# Patient Record
Sex: Female | Born: 1975 | Race: White | Hispanic: No | Marital: Single | Smoking: Current every day smoker
Health system: Southern US, Community
[De-identification: ages and names within clinical notes are randomized; demographics above are authoritative.]

## PROBLEM LIST (undated history)

## (undated) DIAGNOSIS — Z803 Family history of malignant neoplasm of breast: Secondary | ICD-10-CM

## (undated) DIAGNOSIS — G629 Polyneuropathy, unspecified: Secondary | ICD-10-CM

## (undated) DIAGNOSIS — R8781 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) DNA test positive: Secondary | ICD-10-CM

## (undated) DIAGNOSIS — Z8041 Family history of malignant neoplasm of ovary: Secondary | ICD-10-CM

## (undated) DIAGNOSIS — E669 Obesity, unspecified: Secondary | ICD-10-CM

## (undated) DIAGNOSIS — R87612 Low grade squamous intraepithelial lesion on cytologic smear of cervix (LGSIL): Secondary | ICD-10-CM

## (undated) DIAGNOSIS — D649 Anemia, unspecified: Secondary | ICD-10-CM

## (undated) DIAGNOSIS — K760 Fatty (change of) liver, not elsewhere classified: Secondary | ICD-10-CM

## (undated) DIAGNOSIS — L732 Hidradenitis suppurativa: Secondary | ICD-10-CM

## (undated) DIAGNOSIS — Z1371 Encounter for nonprocreative screening for genetic disease carrier status: Secondary | ICD-10-CM

## (undated) DIAGNOSIS — G4730 Sleep apnea, unspecified: Secondary | ICD-10-CM

## (undated) DIAGNOSIS — E119 Type 2 diabetes mellitus without complications: Secondary | ICD-10-CM

## (undated) DIAGNOSIS — J449 Chronic obstructive pulmonary disease, unspecified: Secondary | ICD-10-CM

## (undated) DIAGNOSIS — Z8481 Family history of carrier of genetic disease: Secondary | ICD-10-CM

## (undated) DIAGNOSIS — A630 Anogenital (venereal) warts: Secondary | ICD-10-CM

## (undated) HISTORY — DX: Obesity, unspecified: E66.9

## (undated) HISTORY — DX: Hidradenitis suppurativa: L73.2

## (undated) HISTORY — DX: Family history of carrier of genetic disease: Z84.81

## (undated) HISTORY — DX: Family history of malignant neoplasm of ovary: Z80.41

## (undated) HISTORY — DX: Chronic obstructive pulmonary disease, unspecified: J44.9

## (undated) HISTORY — DX: 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) DNA test positive: R87.810

## (undated) HISTORY — DX: Sleep apnea, unspecified: G47.30

## (undated) HISTORY — DX: Anogenital (venereal) warts: A63.0

## (undated) HISTORY — PX: CHOLECYSTECTOMY: SHX55

## (undated) HISTORY — DX: Type 2 diabetes mellitus without complications: E11.9

## (undated) HISTORY — PX: GALLBLADDER SURGERY: SHX652

## (undated) HISTORY — DX: Encounter for nonprocreative screening for genetic disease carrier status: Z13.71

## (undated) HISTORY — DX: Family history of malignant neoplasm of breast: Z80.3

## (undated) HISTORY — DX: Low grade squamous intraepithelial lesion on cytologic smear of cervix (LGSIL): R87.612

---

## 2010-04-21 ENCOUNTER — Emergency Department: Payer: Self-pay | Admitting: Internal Medicine

## 2010-09-16 ENCOUNTER — Ambulatory Visit: Payer: Self-pay | Admitting: Pain Medicine

## 2012-04-25 ENCOUNTER — Emergency Department: Payer: Self-pay | Admitting: Emergency Medicine

## 2012-04-25 LAB — COMPREHENSIVE METABOLIC PANEL
Alkaline Phosphatase: 93 U/L (ref 50–136)
Anion Gap: 8 (ref 7–16)
BUN: 17 mg/dL (ref 7–18)
Bilirubin,Total: 0.2 mg/dL (ref 0.2–1.0)
Chloride: 108 mmol/L — ABNORMAL HIGH (ref 98–107)
Co2: 26 mmol/L (ref 21–32)
Creatinine: 1.05 mg/dL (ref 0.60–1.30)
EGFR (African American): >60
EGFR (Non-African Amer.): >60
Osmolality: 287 (ref 275–301)
Potassium: 4.3 mmol/L (ref 3.5–5.1)
SGPT (ALT): 36 U/L (ref 12–78)
Sodium: 142 mmol/L (ref 136–145)
Total Protein: 6.9 g/dL (ref 6.4–8.2)

## 2012-04-25 LAB — URINALYSIS, COMPLETE
Bilirubin,UR: NEGATIVE
Hyaline Cast: 2
Ph: 5 (ref 4.5–8.0)
Protein: 30
RBC,UR: 4 /HPF (ref 0–5)
Specific Gravity: 1.041 (ref 1.003–1.030)
Squamous Epithelial: 5

## 2012-04-25 LAB — CBC
HCT: 38.3 % (ref 35.0–47.0)
HGB: 12.5 g/dL (ref 12.0–16.0)
MCH: 28.5 pg (ref 26.0–34.0)
MCHC: 32.5 g/dL (ref 32.0–36.0)
MCV: 88 fL (ref 80–100)
RDW: 14.8 % — ABNORMAL HIGH (ref 11.5–14.5)

## 2012-07-05 DIAGNOSIS — R87612 Low grade squamous intraepithelial lesion on cytologic smear of cervix (LGSIL): Secondary | ICD-10-CM

## 2012-07-05 HISTORY — DX: Low grade squamous intraepithelial lesion on cytologic smear of cervix (LGSIL): R87.612

## 2012-11-22 ENCOUNTER — Ambulatory Visit: Payer: Self-pay | Admitting: Family Medicine

## 2014-03-26 DIAGNOSIS — K589 Irritable bowel syndrome without diarrhea: Secondary | ICD-10-CM | POA: Insufficient documentation

## 2014-03-26 DIAGNOSIS — R1013 Epigastric pain: Secondary | ICD-10-CM | POA: Insufficient documentation

## 2017-01-21 ENCOUNTER — Telehealth: Payer: Self-pay | Admitting: Obstetrics & Gynecology

## 2017-01-21 NOTE — Telephone Encounter (Signed)
Patient being referred by [facility] for HVP level. Left voicemail for patient to call back to be schedule

## 2017-01-21 NOTE — Telephone Encounter (Signed)
Pt is schedule 02/01/17

## 2017-02-01 ENCOUNTER — Encounter: Payer: Self-pay | Admitting: Obstetrics and Gynecology

## 2017-02-01 ENCOUNTER — Ambulatory Visit (INDEPENDENT_AMBULATORY_CARE_PROVIDER_SITE_OTHER): Payer: Medicaid Other | Admitting: Obstetrics and Gynecology

## 2017-02-01 VITALS — BP 140/100 | HR 97 | Ht 66.0 in | Wt 365.0 lb

## 2017-02-01 DIAGNOSIS — Z803 Family history of malignant neoplasm of breast: Secondary | ICD-10-CM | POA: Diagnosis not present

## 2017-02-01 DIAGNOSIS — Z8481 Family history of carrier of genetic disease: Secondary | ICD-10-CM

## 2017-02-01 DIAGNOSIS — A630 Anogenital (venereal) warts: Secondary | ICD-10-CM | POA: Diagnosis not present

## 2017-02-01 DIAGNOSIS — Z124 Encounter for screening for malignant neoplasm of cervix: Secondary | ICD-10-CM

## 2017-02-01 DIAGNOSIS — Z1151 Encounter for screening for human papillomavirus (HPV): Secondary | ICD-10-CM | POA: Diagnosis not present

## 2017-02-01 DIAGNOSIS — R8781 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) DNA test positive: Secondary | ICD-10-CM

## 2017-02-01 DIAGNOSIS — Z1379 Encounter for other screening for genetic and chromosomal anomalies: Secondary | ICD-10-CM

## 2017-02-01 NOTE — Progress Notes (Signed)
Chief Complaint  Patient presents with  . Genital Warts    HPI:      [name] is a 41 y.o. No obstetric history on file. who LMP was Patient's last menstrual period was 01/25/2017., presents today for NP eval of ext genital warts, referred by PCP. She has 2 large warts vulvar area that have been present for several yrs. They were frozen twice in the past with Dr. Cristino Cisneros without any sx change. Pt has tried OTC wart removers without any success. She would like treated today.   She also has a hx of abn paps/POS HPV DNA 2014 and 2015. Last pap 01/03/14 was ASCUS/POS HPV DNA HIGH RISK. She is due for repeat pap today. Last colpo/bx 2014.  She also has a strong FH of breast/ovar cancer. Her affected sister's daughter (niece) had breast cancer in her 82s and was found to be BRCA pos (pt doesn't know 1 or 2). Pt would like genetic testing.     Past Medical History:  Diagnosis Date  . 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) DNA test positive   . Diabetes mellitus without complication ([place])   . Genital warts   . LGSIL on Pap smear of cervix 2014  . Obesity   . Sleep apnea     Past Surgical History:  Procedure Laterality Date  . GALLBLADDER SURGERY      Family History  Problem Relation Age of Onset  . Breast cancer Mother 79  . Breast cancer Sister 58  . Breast cancer Maternal Aunt 53  . Ovarian cancer Maternal Aunt 34  . Breast cancer Other 59       BRCA pos (daughter of affected aunt with breast cancer)    Social History   Social History  . Marital status: Single    Spouse name: N/A  . Number of children: N/A  . Years of education: N/A   Occupational History  . Not on file.   Social History Main Topics  . Smoking status: Current Every Day Smoker  . Smokeless tobacco: Never Used  . Alcohol use Yes  . Drug use: No  . Sexual activity: No   Other Topics Concern  . Not on file   Social History Narrative  . No narrative on file     Current Outpatient  Prescriptions:  .  metFORMIN (GLUCOPHAGE) 1000 MG tablet, Take 1,000 mg by mouth 2 (two) times daily with a meal., Disp: , Rfl:  .  cyclobenzaprine (FLEXERIL) 10 MG tablet, Take 10 mg by mouth., Disp: , Rfl:  .  FLUoxetine (PROZAC) 40 MG capsule, Take by mouth., Disp: , Rfl:  .  gabapentin (NEURONTIN) 300 MG capsule, Take by mouth., Disp: , Rfl:  .  Lisinopril 1 MG/ML SOLN, lisinopril, Disp: , Rfl:  .  METFORMIN & DIET MANAGE PROD PO, metformin, Disp: , Rfl:    ROS:  Review of Systems  Constitutional: Negative for fever.  Gastrointestinal: Negative for blood in stool, constipation, diarrhea, nausea and vomiting.  Genitourinary: Positive for genital sores. Negative for dyspareunia, dysuria, flank pain, frequency, hematuria, urgency, vaginal bleeding, vaginal discharge and vaginal pain.  Musculoskeletal: Negative for back pain.  Skin: Negative for rash.     OBJECTIVE:   Vitals:  BP (!) 140/100   Pulse 97   Ht '5\' 6"'  (1.676 m)   Wt (!) 365 lb (165.6 kg)   LMP 01/25/2017   BMI 58.91 kg/m   Physical Exam  Constitutional: She is oriented to  person, place, and time and well-developed, well-nourished, and in no distress. Vital signs are normal.  Genitourinary: Vagina normal, uterus normal, cervix normal, right adnexa normal and left adnexa normal. Uterus is not enlarged. Cervix exhibits no motion tenderness and no tenderness. Right adnexum displays no mass and no tenderness. Left adnexum displays no mass and no tenderness. Vulva exhibits lesion. Vulva exhibits no erythema, no exudate, no rash and no tenderness. Vagina exhibits no lesion.  Genitourinary Comments: 2 LARGE WARTY LESION RT PERINEAL AREA; ~2.0 CM LESION AND ~1 CM LESION  Neurological: She is oriented to person, place, and time.  Vitals reviewed.    Assessment/Plan: Genital warts - Treated with TCA. Wash in 4-6 hrs. F/u at 6 wk MyRisk results f/u. If no sx improvement, will have PCP refer to derm ([facility]) for further tx.    Family history of breast cancer - Plan: Integrated BRACAnalysis  Genetic testing - BRCA mutation in niece with breast cancer. MyRisk testing discussed and done today. RTO 6 wks for results.  - Plan: Integrated BRACAnalysis  Cervical cancer screening - Will call pt with resutls. - Plan: IGP, Aptima HPV  Screening for HPV (human papillomavirus) - Plan: IGP, Aptima HPV  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) DNA test positive - Plan: IGP, Aptima HPV  Family history of BRCA gene positive     Return in about 6 weeks (around 03/15/2017) for [facility] f/u.  Amy Cisneros B. Amy Villarruel, PA-C 02/01/2017 10:49 AM

## 2017-02-02 DIAGNOSIS — Z1371 Encounter for nonprocreative screening for genetic disease carrier status: Secondary | ICD-10-CM

## 2017-02-02 HISTORY — DX: Encounter for nonprocreative screening for genetic disease carrier status: Z13.71

## 2017-02-03 LAB — IGP, APTIMA HPV
HPV APTIMA: NEGATIVE
PAP Smear Comment: 0

## 2017-02-04 ENCOUNTER — Encounter: Payer: Self-pay | Admitting: Obstetrics and Gynecology

## 2017-02-08 DIAGNOSIS — M5136 Other intervertebral disc degeneration, lumbar region: Secondary | ICD-10-CM | POA: Insufficient documentation

## 2017-02-08 DIAGNOSIS — F1190 Opioid use, unspecified, uncomplicated: Secondary | ICD-10-CM | POA: Insufficient documentation

## 2017-02-08 DIAGNOSIS — M5442 Lumbago with sciatica, left side: Secondary | ICD-10-CM

## 2017-02-08 DIAGNOSIS — M47816 Spondylosis without myelopathy or radiculopathy, lumbar region: Secondary | ICD-10-CM | POA: Insufficient documentation

## 2017-02-08 DIAGNOSIS — G894 Chronic pain syndrome: Secondary | ICD-10-CM | POA: Insufficient documentation

## 2017-02-08 DIAGNOSIS — M179 Osteoarthritis of knee, unspecified: Secondary | ICD-10-CM | POA: Insufficient documentation

## 2017-02-08 DIAGNOSIS — M5126 Other intervertebral disc displacement, lumbar region: Secondary | ICD-10-CM | POA: Insufficient documentation

## 2017-02-08 DIAGNOSIS — G8929 Other chronic pain: Secondary | ICD-10-CM | POA: Insufficient documentation

## 2017-02-08 DIAGNOSIS — M1710 Unilateral primary osteoarthritis, unspecified knee: Secondary | ICD-10-CM | POA: Insufficient documentation

## 2017-02-08 DIAGNOSIS — M5416 Radiculopathy, lumbar region: Secondary | ICD-10-CM | POA: Insufficient documentation

## 2017-02-08 DIAGNOSIS — Z6841 Body Mass Index (BMI) 40.0 and over, adult: Secondary | ICD-10-CM

## 2017-02-08 NOTE — Progress Notes (Signed)
Patient's Name: Amy Cisneros  MRN: [id number]  Referring Provider: Geannie Risen, MD  DOB: 1976/06/30  PCP: [name], MD  DOS: 02/09/2017  Note by: Gaspar Cola, MD  Service setting: Ambulatory outpatient  Specialty: Interventional Pain Management  Location: [facility] (AMB) Pain Management Facility  Visit type: Initial Patient Evaluation  Patient type: New Patient   Primary Reason(s) for Visit: Encounter for initial evaluation of one or more chronic problems (new to examiner) potentially causing chronic pain, and posing a threat to normal musculoskeletal function. (Level of risk: High) CC: Back Pain (lower bilateral); Shoulder Pain (bilateral); Knee Pain (bilateral); and Hip Pain (bilateral)  HPI  Amy Cisneros is a 41 y.o. year old, female patient, who comes today to see [place] for the first time for an initial evaluation of her chronic pain. She has Family history of breast cancer; 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) DNA test positive; Family history of BRCA gene positive; Dyspepsia; IBS (irritable bowel syndrome); Osteoarthritis of knee; Chronic pain syndrome; Opiate use; Lumbar paracentral disc bulge at L2-3 (Left); Lumbar facet hypertrophy (Bilateral); Chronic lumbar radiculitis of L3 (Left); Chronic low back pain (Primary Area of Pain) (Bilateral) (L>R); Morbid obesity with BMI of 50.0-59.9, adult ([place]); Chronic lower extremity pain (Secondary Area of Pain) (Bilateral) (R>L); Chronic knee pain (Tertiary Area of Pain) (Bilateral) (R>L); Chronic hip pain (Fourth Area of Pain) (Bilateral) (R>L); Chronic shoulder pain (Fifth Area of Pain) (Bilateral) (R>L); Chronic neck pain (Bilateral) (R>L); and NSAID long-term use on her problem list. Today she comes in for evaluation of her Back Pain (lower bilateral); Shoulder Pain (bilateral); Knee Pain (bilateral); and Hip Pain (bilateral)  Pain Assessment: Location: Left, Right, Lower (bilateral)  (shoulders, hips and knees) Radiating: shoulder  pain radiates down arms into fingers,  Onset: More than a month ago Duration: Chronic pain Quality: Constant, Numbness, Burning, Discomfort, Restless Severity: 8 /10 (self-reported pain score)  Note: Reported level is inconsistent with clinical observations. Clinically the patient looks like a 2/10 Information on the proper use of the pain scale provided to the patient today Effect on ADL: activity has to be done in short time frames  Timing: Constant Modifying factors: after getting up from sitting, walking to get pain worked out.  medications  Onset and Duration: Present longer than 3 months Cause of pain: Unknown Severity: Getting worse, NAS-11 at its worse: 12/10, NAS-11 at its best: 7/10, NAS-11 now: 9/10 and NAS-11 on the average: 11/10 Timing: Not influenced by the time of the day Aggravating Factors: Bending, Climbing, Kneeling, Lifiting, Motion, Prolonged sitting, Prolonged standing, Squatting, Stooping , Twisting, Walking, Walking uphill and Walking downhill Alleviating Factors: Hot packs, Lying down, Medications, Resting, Sleeping and Warm showers or baths Associated Problems: Day-time cramps, Night-time cramps, Depression, Fatigue, Nausea, Numbness, Swelling, Tingling, Weakness, Pain that wakes patient up and Pain that does not allow patient to sleep Quality of Pain: Aching, Agonizing, Annoying, Burning, Constant, Cramping, Deep, Disabling, Dreadful, Exhausting, Getting longer, Horrible, Hot, Pulsating, Sharp, Shooting, Sickening, Stabbing, Tender, Throbbing, Tingling, Tiring and Uncomfortable Previous Examinations or Tests: MRI scan, X-rays, Nerve conduction test and Orthopedic evaluation Previous Treatments: Narcotic medications  The patient comes into the clinics today for the first time for a chronic pain management evaluation. According to the patient the primary area of pain is that of the lower back with the pain being bilateral. However, her pain seems to be worse on the  right side when compared to the left. She denies any prior surgeries, nerve blocks,  joint injections, physical therapy, or any recent x-rays.  The patient's second area of pain is that of the lower extremities with the right being worst on the left. She indicates having had a nerve conduction test done at Dr. Gala Murdoch office. Today we have requested copies of those. The patient denies any lower extremity surgeries. She indicates having had some physical therapy in the 1990s which consisted of 2-3 visits per week for a period of 6 weeks. In terms of the distribution of the pain the patient describes that in the case of the right lower extremity the pain goes all the way down to the top of her foot in what seems to be an L5 dermatomal distribution. In the case of the left lower extremity, the distribution is the pain is exactly the same as in the right leg, but not quite as intense.  The third area of pain is that of the knees with the right knee being worse than the left. She again denies any surgeries, physical therapy, but does admit to some x-rays that were done approximately 2 years ago. She also indicates having had one knee injection on the right knee which helped for approximately 3 days.  The next area of pain is that of the hips with the right being worst on the left. She denies any surgeries, joint injections, nerve blocks, x-rays, or physical therapy.  Next is the pain on the shoulders which she indicates is worse on the right side. Again, she denies any physical therapy, surgeries, joint injection, nerve blocks, or recent x-rays. She does indicate having some intermittent neck pain in the posterior aspect affecting both sides but with the right side being worst on the left. She denies any surgeries, nerve blocks, joint injections, physical therapy, or x-rays of that area.  The patient indicates treating her pain with gabapentin, Flexeril, naproxen, and extended release acetaminophen. She  indicates that she was taking some tramadol but this was making her a little sleepy and they stopped it a couple years ago.  Today I took the time to provide the patient with information regarding my pain practice. The patient was informed that my practice is divided into two sections: an interventional pain management section, as well as a completely separate and distinct medication management section. I explained that I have procedure days for my interventional therapies, and evaluation days for follow-ups and medication management. Because of the amount of documentation required during both, they are kept separated. This means that there is the possibility that she may be scheduled for a procedure on one day, and medication management the next. I have also informed her that because of staffing and facility limitations, I no longer take patients for medication management only. To illustrate the reasons for this, I gave the patient the example of surgeons, and how inappropriate it would be to refer a patient to his/her care, just to write for the post-surgical antibiotics on a surgery done by a different surgeon.   Because interventional pain management is my board-certified specialty, the patient was informed that joining my practice means that they are open to any and all interventional therapies. I made it clear that this does not mean that they will be forced to have any procedures done. What this means is that I believe interventional therapies to be essential part of the diagnosis and proper management of chronic pain conditions. Therefore, patients not interested in these interventional alternatives will be better served under the care of a different practitioner.  The patient was also made aware of my Comprehensive Pain Management Safety Guidelines where by joining my practice, they limit all of their nerve blocks and joint injections to those done by our practice, for as long as we are retained to  manage their care.   Historic Controlled Substance Pharmacotherapy Review  PMP and historical list of controlled substances: Tramadol 50 mg; hydrocodone/APAP 5/325; hydrocodone/APAP 7.5/325.  Highest opioid analgesic regimen found: Hydrocodone/APAP 5/325, one tablet by mouth 5 times a day (25 mg/day of hydrocodone) (25 MME/day) Most recent opioid analgesic: Tramadol 50 mg 1 tablet by mouth twice a day (100 mg/day of tramadol) (10 MME/day) (last prescribed on 11/14/2014 and filled on 12/11/2014) ( Current opioid analgesics: None  Highest recorded MME/day: 25 mg/day MME/day: 0 mg/day Medications: The patient did not bring the medication(s) to the appointment, as requested in our "New Patient Package" Pharmacodynamics: Desired effects: Analgesia: The patient reports >50% benefit. Reported improvement in function: The patient reports medication allows her to accomplish basic ADLs. Clinically meaningful improvement in function (CMIF): Sustained CMIF goals met Perceived effectiveness: Described as relatively effective, allowing for increase in activities of daily living (ADL) Undesirable effects: Side-effects or Adverse reactions: None reported Historical Monitoring: The patient  reports that she does not use drugs. List of all UDS Test(s): No results found for: MDMA, COCAINSCRNUR, PCPSCRNUR, PCPQUANT, CANNABQUANT, THCU, [place] List of all Serum Drug Screening Test(s):  No results found for: AMPHSCRSER, BARBSCRSER, BENZOSCRSER, COCAINSCRSER, PCPSCRSER, PCPQUANT, THCSCRSER, CANNABQUANT, OPIATESCRSER, OXYSCRSER, PROPOXSCRSER Historical Background Evaluation: [place] PDMP: Six (6) year initial data search conducted.             [place] Department of public safety, offender search: Editor, commissioning Information) Non-contributory Risk Assessment Profile: Aberrant behavior: None observed or detected today Risk factors for fatal opioid overdose: None identified today Fatal overdose hazard ratio (HR): Calculation  deferred Non-fatal overdose hazard ratio (HR): Calculation deferred Risk of opioid abuse or dependence: 0.7-3.0% with doses ? 36 MME/day and 6.1-26% with doses ? 120 MME/day. Substance use disorder (SUD) risk level: Pending results of Medical Psychology Evaluation for SUD Opioid risk tool (ORT) (Total Score): 4     Opioid Risk Tool - 02/09/17 1001      Family History of Substance Abuse   Alcohol Positive Female  father sober now   Illegal Drugs Negative   Rx Drugs Negative     Personal History of Substance Abuse   Alcohol Negative   Illegal Drugs Negative   Rx Drugs Negative     Psychological Disease   Psychological Disease Positive   ADD Negative   OCD Positive   Bipolar Negative   Schizophrenia Negative   Depression Positive  prozac handles depression     Total Score   Opioid Risk Tool Scoring 4   Opioid Risk Interpretation Moderate Risk     ORT Scoring interpretation table:  Score <3 = Low Risk for SUD  Score between 4-7 = Moderate Risk for SUD  Score >8 = High Risk for Opioid Abuse   PHQ-2 Depression Scale:  Total score: 0  PHQ-2 Scoring interpretation table: (Score and probability of major depressive disorder)  Score 0 = No depression  Score 1 = 15.4% Probability  Score 2 = 21.1% Probability  Score 3 = 38.4% Probability  Score 4 = 45.5% Probability  Score 5 = 56.4% Probability  Score 6 = 78.6% Probability   PHQ-9 Depression Scale:  Total score: 0  PHQ-9 Scoring interpretation table:  Score 0-4 = No depression  Score 5-9 = Mild depression  Score 10-14 = Moderate depression  Score 15-19 = Moderately severe depression  Score 20-27 = Severe depression (2.4 times higher risk of SUD and 2.89 times higher risk of overuse)   Pharmacologic Plan: Pending ordered tests and/or consults            Initial impression: Pending review of available data and ordered tests.  Meds   Current Meds  Medication Sig  . acetaminophen (ACETAMINOPHEN 8 HOUR) 650 MG CR  tablet Take 650 mg by mouth every 8 (eight) hours as needed for pain.  Marland Kitchen albuterol (ACCUNEB) 0.63 MG/3ML nebulizer solution Take 1 ampule by nebulization every 6 (six) hours as needed for wheezing.  Marland Kitchen albuterol (PROVENTIL HFA;VENTOLIN HFA) 108 (90 Base) MCG/ACT inhaler Inhale 2 puffs into the lungs every 6 (six) hours as needed for wheezing or shortness of breath.  . ciprofloxacin (CIPRO) 500 MG tablet Take 500 mg by mouth daily.  . clindamycin (CLEOCIN) 300 MG capsule Take 300 mg by mouth 2 (two) times daily.  . cyclobenzaprine (FLEXERIL) 10 MG tablet Take 10 mg by mouth 3 (three) times daily.   Marland Kitchen FLUoxetine (PROZAC) 40 MG capsule Take 40 mg by mouth daily.   Marland Kitchen gabapentin (NEURONTIN) 300 MG capsule Take 300 mg by mouth 3 (three) times daily.   Marland Kitchen lisinopril-hydrochlorothiazide (PRINZIDE,ZESTORETIC) 10-12.5 MG tablet Take 1 tablet by mouth daily.  . medroxyPROGESTERone (PROVERA) 10 MG tablet Take 10 mg by mouth daily.  . metFORMIN (GLUCOPHAGE) 1000 MG tablet Take 1,000 mg by mouth 2 (two) times daily with a meal.  . naproxen (NAPROSYN) 500 MG tablet Take 500 mg by mouth 2 (two) times daily with a meal.  . tiotropium (SPIRIVA) 18 MCG inhalation capsule Place 18 mcg into inhaler and inhale daily.  . traZODone (DESYREL) 50 MG tablet Take 50 mg by mouth at bedtime.    Imaging Review  Lumbosacral Imaging: Lumbar MR wo contrast:  Results for orders placed in visit on 11/22/12  MR L Spine Ltd W/O Cm   Narrative * PRIOR REPORT IMPORTED FROM AN EXTERNAL SYSTEM *   PRIOR REPORT IMPORTED FROM THE SYNGO [place] EXAM:    low back pain  COMMENTS:   PROCEDURE:     MMR - MMR LUMBAR SPINE WO CONTRAST  - Nov 22 2012 11:47AM   RESULT:     MRI LUMBAR SPINE WITHOUT CONTRAST   HISTORY: Low back pain   COMPARISON: None   TECHNIQUE: Multiplanar and multisequence MRI of the lumbar spine were  obtained, without administration of IV contrast.   FINDINGS:   The vertebral bodies of  the lumbar spine are normal in size and alignment.  There is normal bone marrow signal demonstrated throughout the vertebra.  The  intervertebral disc spaces are well-maintained.   The spinal cord is of normal volume and contour. The cord terminates  normally at L1 . The nerve roots of the cauda equina and the filum  terminale  have the usual appearance.   The visualized portions of the SI joints are unremarkable.   The imaged intra-abdominal contents are unremarkable.   T12-L1: No significant disc bulge. No evidence of neural foraminal or  central stenosis.   L1-L2: No significant disc bulge. No evidence of neural foraminal or  central  stenosis.   L2-L3: Mild left paracentral disc bulge which abuts the left intraspinal  L3  nerve root. No evidence of neural foraminal or central stenosis.   L3-L4:  No significant disc bulge. No evidence of neural foraminal or  central  stenosis.   L4-L5: No significant disc bulge. No evidence of neural foraminal or  central  stenosis. Mild bilateral facet arthropathy.   L5-S1: No significant disc bulge. No evidence of neural foraminal or  central  stenosis.   IMPRESSION:   1. Mild left paracentral disc bulge at L2-L3 which abuts the left  intraspinal L3 nerve root.   Dictation Site: 1       Complexity Note: Imaging results reviewed. Results discussed using Layman's terms.               ROS  Cardiovascular History: No reported cardiovascular signs or symptoms such as High blood pressure, coronary artery disease, abnormal heart rate or rhythm, heart attack, blood thinner therapy or heart weakness and/or failure Pulmonary or Respiratory History: Lung problems, Shortness of breath, Smoking, Coughing up mucus (Bronchitis) and Temporary stoppage of breathing during sleep Neurological History: No reported neurological signs or symptoms such as seizures, abnormal skin sensations, urinary and/or fecal incontinence, being born with an  abnormal open spine and/or a tethered spinal cord Review of Past Neurological Studies: No results found for this or any previous visit. Psychological-Psychiatric History: Anxiousness and Depressed Gastrointestinal History: Reflux or heatburn Genitourinary History: Recurrent Urinary Tract infections Hematological History: No reported hematological signs or symptoms such as prolonged bleeding, low or poor functioning platelets, bruising or bleeding easily, hereditary bleeding problems, low energy levels due to low hemoglobin or being anemic Endocrine History: High blood sugar controlled without the use of insulin (NIDDM) Rheumatologic History: Joint aches and or swelling due to excess weight (Osteoarthritis) Musculoskeletal History: Negative for myasthenia gravis, muscular dystrophy, multiple sclerosis or malignant hyperthermia Work History: Quit going to work on his/her own  Allergies  Ms. Lohmann is allergic to codeine and morphine.  Laboratory Chemistry  Inflammation Markers (CRP: Acute Phase) (ESR: Chronic Phase) No results found for: CRP, ESRSEDRATE               Renal Function Markers Lab Results  Component Value Date   BUN 17 04/25/2012   CREATININE 1.05 04/25/2012   GFRAA >60 04/25/2012   GFRNONAA >60 04/25/2012                 Hepatic Function Markers Lab Results  Component Value Date   AST 29 04/25/2012   ALT 36 04/25/2012   ALBUMIN 3.0 (L) 04/25/2012   ALKPHOS 93 04/25/2012                 Electrolytes Lab Results  Component Value Date   NA 142 04/25/2012   K 4.3 04/25/2012   CL 108 (H) 04/25/2012   CALCIUM 8.4 (L) 04/25/2012                 Neuropathy Markers No results found for: [id number]               Bone Pathology Markers Lab Results  Component Value Date   ALKPHOS 93 04/25/2012   CALCIUM 8.4 (L) 04/25/2012                 Coagulation Parameters Lab Results  Component Value Date   PLT 327 04/25/2012                 Cardiovascular Markers Lab  Results  Component Value Date   HGB 12.5 04/25/2012   HCT 38.3 04/25/2012  Note: Lab results reviewed.  PFSH  Drug: Ms. Wasilewski  reports that she does not use drugs. Alcohol:  reports that she drinks alcohol. Tobacco:  reports that she has been smoking.  She has been smoking about 0.50 packs per day. She has never used smokeless tobacco. Medical:  has a past medical history of 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) DNA test positive; COPD (chronic obstructive pulmonary disease) ([place]); Diabetes mellitus without complication ([place]); Genital warts; LGSIL on Pap smear of cervix (2014); Obesity; and Sleep apnea. Family: family history includes Breast cancer (age of onset: 24) in her other; Breast cancer (age of onset: 42) in her sister; Breast cancer (age of onset: 63) in her maternal aunt; Breast cancer (age of onset: 76) in her mother; Diabetes in her father and mother; Kidney disease in her father; Ovarian cancer (age of onset: 55) in her maternal aunt.  Past Surgical History:  Procedure Laterality Date  . GALLBLADDER SURGERY     Active Ambulatory Problems    Diagnosis Date Noted  . Family history of breast cancer 02/01/2017  . 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) DNA test positive 02/01/2017  . Family history of BRCA gene positive 02/01/2017  . Dyspepsia 03/26/2014  . IBS (irritable bowel syndrome) 03/26/2014  . Osteoarthritis of knee 02/08/2017  . Chronic pain syndrome 02/08/2017  . Opiate use 02/08/2017  . 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 Lumbar facet hypertrophy (Bilateral) 02/08/2017  . Chronic lumbar radiculitis of L3 (Left) 02/08/2017  . Chronic low back pain (Primary Area of Pain) (Bilateral) (L>R) 02/08/2017  . Morbid obesity with BMI of 50.0-59.9, adult ([place]) 02/08/2017  . Chronic lower extremity pain (Secondary Area of Pain) (Bilateral) (R>L) 02/09/2017  . Chronic knee pain [facility] Area of Pain) (Bilateral) (R>L)  02/09/2017  . Chronic hip pain (Fourth Area of Pain) (Bilateral) (R>L) 02/09/2017  . Chronic shoulder pain (Fifth Area of Pain) (Bilateral) (R>L) 02/09/2017  . Chronic neck pain (Bilateral) (R>L) 02/09/2017  . NSAID long-term use 02/09/2017   Resolved Ambulatory Problems    Diagnosis Date Noted  . No Resolved Ambulatory Problems   Past Medical History:  Diagnosis Date  . 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) DNA test positive   . COPD (chronic obstructive pulmonary disease) ([place])   . Diabetes mellitus without complication ([place])   . Genital warts   . LGSIL on Pap smear of cervix 2014  . Obesity   . Sleep apnea    Constitutional Exam  General appearance: Well nourished, well developed, and well hydrated. In no apparent acute distress. Morbid obesity. Vitals:   02/09/17 0937  BP: (!) 153/92  Pulse: 90  Resp: 16  Temp: 97.9 F (36.6 C)  TempSrc: Oral  SpO2: 98%  Weight: (!) 370 lb (167.8 kg)  Height: '5\' 6"'  (1.676 m)   BMI Assessment: Estimated body mass index is 59.72 kg/m as calculated from the following:   Height as of this encounter: '5\' 6"'  (1.676 m).   Weight as of this encounter: 370 lb (167.8 kg).  BMI interpretation table: BMI level Category Range association with higher incidence of chronic pain  <18 kg/m2 Underweight   18.5-24.9 kg/m2 Ideal body weight   25-29.9 kg/m2 Overweight Increased incidence by 20%  30-34.9 kg/m2 Obese (Class I) Increased incidence by 68%  35-39.9 kg/m2 Severe obesity (Class II) Increased incidence by 136%  >40 kg/m2 Extreme obesity (Class III) Increased incidence by 254%   BMI Readings from Last 4 Encounters:  02/09/17 59.72 kg/m  02/01/17 58.91 kg/m   Wt Readings from Last 4 Encounters:  02/09/17 (!) 370 lb (167.8 kg)  02/01/17 (!) 365 lb (165.6 kg)  Psych/Mental status: Alert, oriented x 3 (person, place, & time)       Eyes: PERLA Respiratory: No evidence of acute respiratory distress  Cervical Spine Area Exam  Skin  & Axial Inspection: No masses, redness, edema, swelling, or associated skin lesions Alignment: Symmetrical Functional ROM: Unrestricted ROM      Stability: No instability detected Muscle Tone/Strength: Functionally intact. No obvious neuro-muscular anomalies detected. Sensory (Neurological): Unimpaired Palpation: No palpable anomalies              Upper Extremity (UE) Exam    Side: Right upper extremity  Side: Left upper extremity  Skin & Extremity Inspection: Skin color, temperature, and hair growth are WNL. No peripheral edema or cyanosis. No masses, redness, swelling, asymmetry, or associated skin lesions. No contractures.  Skin & Extremity Inspection: Skin color, temperature, and hair growth are WNL. No peripheral edema or cyanosis. No masses, redness, swelling, asymmetry, or associated skin lesions. No contractures.  Functional ROM: Unrestricted ROM          Functional ROM: Unrestricted ROM          Muscle Tone/Strength: Functionally intact. No obvious neuro-muscular anomalies detected.  Muscle Tone/Strength: Functionally intact. No obvious neuro-muscular anomalies detected.  Sensory (Neurological): Unimpaired  Sensory (Neurological): Unimpaired  Palpation: No palpable anomalies              Palpation: No palpable anomalies              Specialized Test(s): Deferred         Specialized Test(s): Deferred          Thoracic Spine Area Exam  Skin & Axial Inspection: No masses, redness, or swelling Alignment: Symmetrical Functional ROM: Unrestricted ROM Stability: No instability detected Muscle Tone/Strength: Functionally intact. No obvious neuro-muscular anomalies detected. Sensory (Neurological): Unimpaired Muscle strength & Tone: No palpable anomalies  Lumbar Spine Area Exam  Skin & Axial Inspection: No masses, redness, or swelling Alignment: Symmetrical Functional ROM: Limited ROM      Stability: No instability detected Muscle Tone/Strength: Functionally intact. No obvious  neuro-muscular anomalies detected. Sensory (Neurological): Movement-associated pain Palpation: No palpable anomalies       Provocative Tests: Lumbar Hyperextension and rotation test: evaluation deferred today       Lumbar Lateral bending test: evaluation deferred today       Patrick's Maneuver: evaluation deferred today                    Gait & Posture Assessment  Ambulation: Patient ambulates using a cane Gait: Very limited, using assistive device to ambulate. The patient's gait is also typical of morbidly obese patients. Posture: Antalgic   Lower Extremity Exam    Side: Right lower extremity  Side: Left lower extremity  Skin & Extremity Inspection: Skin color, temperature, and hair growth are WNL. No peripheral edema or cyanosis. No masses, redness, swelling, asymmetry, or associated skin lesions. No contractures.  Skin & Extremity Inspection: Skin color, temperature, and hair growth are WNL. No peripheral edema or cyanosis. No masses, redness, swelling, asymmetry, or associated skin lesions. No contractures.  Functional ROM: Unrestricted ROM          Functional ROM: Unrestricted ROM          Muscle Tone/Strength: Functionally intact. No obvious neuro-muscular anomalies detected.  Muscle Tone/Strength:  Functionally intact. No obvious neuro-muscular anomalies detected.  Sensory (Neurological): Unimpaired  Sensory (Neurological): Unimpaired  Palpation: No palpable anomalies  Palpation: No palpable anomalies   Assessment  Primary Diagnosis & Pertinent Problem List: The primary encounter diagnosis was Chronic pain syndrome. 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  Visit Diagnosis (New problems to examiner): 1. Chronic pain syndrome   2. 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 Lumbar facet hypertrophy (Bilateral)   4. Chronic lumbar radiculitis of L3 (Left)   5. Chronic low back pain (Bilateral) (L>R)   6. Chronic lower extremity pain (Secondary Area of Pain) (Bilateral) (R>L)   7. Chronic knee pain (Tertiary Area of Pain) (Bilateral) (R>L)   8. Chronic hip pain (Fourth Area of Pain) (Bilateral) (R>L)   9. Chronic shoulder pain (Fifth Area of Pain) (Bilateral) (R>L)   10. Chronic neck pain (Bilateral) (R>L)   11. NSAID long-term use   12. Opiate use   13. Morbid obesity with BMI of 50.0-59.9, adult [facility])    Plan of Care (Initial workup plan)  Note: Ms. Demuro was reminded that as per protocol, today's visit has been an evaluation only. We have not taken over the patient's controlled substance management.  Problem-specific plan: No problem-specific Assessment & Plan notes found for this encounter.  Lab Orders     Compliance Drug Analysis, Ur     Comprehensive metabolic panel     C-reactive protein     Sedimentation rate     Magnesium     25-Hydroxyvitamin D Lcms D2+D3     Vitamin B12  Imaging Orders     DG Cervical Spine Complete     DG HIP UNILAT W OR W/O PELVIS 2-3 VIEWS LEFT     DG HIP UNILAT W OR W/O PELVIS 2-3 VIEWS RIGHT     DG Knee 1-2 Views Left     DG Knee 1-2 Views Right     DG Shoulder Left     DG Shoulder Right     DG Lumbar Spine Complete W/Bend  Referral Orders     Ambulatory referral to Psychology   Procedure Orders    No procedure(s) ordered today   Pharmacotherapy (current): Medications ordered:  No orders of the defined types were placed in this encounter.  Medications administered  during this visit: Ms. Sindoni had no medications administered during this visit.   Pharmacological management options:  Opioid Analgesics: The patient was informed that there is no guarantee that she would be a candidate for opioid analgesics. The decision will be made following CDC guidelines. This decision will be based on the results of diagnostic studies, as well as Ms. Lesperance's risk profile.   Membrane stabilizer: To be determined at a later time  Muscle relaxant: To be determined at a later time  NSAID: To be determined at a later time  Other analgesic(s): To be determined at a later time   Interventional management options: Ms. Kunka was informed that there is no guarantee that she would be  a candidate for interventional therapies. The decision will be based on the results of diagnostic studies, as well as Ms. Tatham's risk profile.  Procedure(s) under consideration:  Diagnostic bilateral lumbar facet block  Possible bilateral lumbar facet RFA  Diagnostic L2-3 lumbar epidural steroid injection  Diagnostic bilateral L2-3 transforaminal epidural steroid injection  Diagnostic bilateral intra-articular knee joint injection  Possible series of 5 bilateral intra-articular Hyalgan knee injections  Diagnostic bilateral Genicular nerve block  Possible bilateral Genicular nerve RFA  Diagnostic bilateral intra-articular hip joint injection  Diagnostic bilateral femoral nerve + obturator nerve block  Possible bilateral femoral nerve + obturator nerve RFA  Diagnostic bilateral intra-articular shoulder joint injection  Diagnostic bilateral suprascapular nerve block  Possible bilateral suprascapular nerve RFA  Diagnostic right-sided cervical epidural steroid injection  Diagnostic bilateral cervical facet block  Possible bilateral cervical facet RFA   Other:  The patient will need to work on bringing her weight down to a BMI below 30. This will need to be done by the primary care physician. However,  I believe that this patient would benefit from a consult with a nutritionist as well as a Ambulance person.  Today we will request a copy of the nerve conduction test done by Dr. Brunetta Genera.   Provider-requested follow-up: Return for 2nd Visit with Dr. Dossie Arbour after [facility] eval.  Future Appointments Date Time Provider [place]  03/15/2017 2:95 AM Copland, Deirdre Evener, PA-C [facility] None    Primary Care Physician: [name], MD Location: [facility] Note by: Gaspar Cola, MD Date: 02/09/2017; Time: 12:00 PM

## 2017-02-09 ENCOUNTER — Ambulatory Visit: Payer: Medicaid Other | Attending: Pain Medicine | Admitting: Pain Medicine

## 2017-02-09 ENCOUNTER — Encounter: Payer: Self-pay | Admitting: Pain Medicine

## 2017-02-09 VITALS — BP 153/92 | HR 90 | Temp 97.9°F | Resp 16 | Ht 66.0 in | Wt 370.0 lb

## 2017-02-09 DIAGNOSIS — M25552 Pain in left hip: Secondary | ICD-10-CM | POA: Insufficient documentation

## 2017-02-09 DIAGNOSIS — M5416 Radiculopathy, lumbar region: Secondary | ICD-10-CM | POA: Diagnosis not present

## 2017-02-09 DIAGNOSIS — Z841 Family history of disorders of kidney and ureter: Secondary | ICD-10-CM | POA: Diagnosis not present

## 2017-02-09 DIAGNOSIS — Z7984 Long term (current) use of oral hypoglycemic drugs: Secondary | ICD-10-CM | POA: Insufficient documentation

## 2017-02-09 DIAGNOSIS — M1710 Unilateral primary osteoarthritis, unspecified knee: Secondary | ICD-10-CM | POA: Diagnosis not present

## 2017-02-09 DIAGNOSIS — M79604 Pain in right leg: Secondary | ICD-10-CM | POA: Insufficient documentation

## 2017-02-09 DIAGNOSIS — G894 Chronic pain syndrome: Secondary | ICD-10-CM | POA: Insufficient documentation

## 2017-02-09 DIAGNOSIS — J449 Chronic obstructive pulmonary disease, unspecified: Secondary | ICD-10-CM | POA: Insufficient documentation

## 2017-02-09 DIAGNOSIS — Z6841 Body Mass Index (BMI) 40.0 and over, adult: Secondary | ICD-10-CM | POA: Diagnosis not present

## 2017-02-09 DIAGNOSIS — M79605 Pain in left leg: Secondary | ICD-10-CM | POA: Insufficient documentation

## 2017-02-09 DIAGNOSIS — R1013 Epigastric pain: Secondary | ICD-10-CM | POA: Insufficient documentation

## 2017-02-09 DIAGNOSIS — M25511 Pain in right shoulder: Secondary | ICD-10-CM | POA: Diagnosis not present

## 2017-02-09 DIAGNOSIS — Z833 Family history of diabetes mellitus: Secondary | ICD-10-CM | POA: Diagnosis not present

## 2017-02-09 DIAGNOSIS — M25559 Pain in unspecified hip: Secondary | ICD-10-CM | POA: Diagnosis not present

## 2017-02-09 DIAGNOSIS — E119 Type 2 diabetes mellitus without complications: Secondary | ICD-10-CM | POA: Insufficient documentation

## 2017-02-09 DIAGNOSIS — M25551 Pain in right hip: Secondary | ICD-10-CM | POA: Diagnosis not present

## 2017-02-09 DIAGNOSIS — G4730 Sleep apnea, unspecified: Secondary | ICD-10-CM | POA: Diagnosis not present

## 2017-02-09 DIAGNOSIS — M5126 Other intervertebral disc displacement, lumbar region: Secondary | ICD-10-CM | POA: Diagnosis not present

## 2017-02-09 DIAGNOSIS — Z803 Family history of malignant neoplasm of breast: Secondary | ICD-10-CM | POA: Insufficient documentation

## 2017-02-09 DIAGNOSIS — Z87440 Personal history of urinary (tract) infections: Secondary | ICD-10-CM | POA: Insufficient documentation

## 2017-02-09 DIAGNOSIS — M47816 Spondylosis without myelopathy or radiculopathy, lumbar region: Secondary | ICD-10-CM

## 2017-02-09 DIAGNOSIS — M5442 Lumbago with sciatica, left side: Secondary | ICD-10-CM | POA: Diagnosis not present

## 2017-02-09 DIAGNOSIS — M5136 Other intervertebral disc degeneration, lumbar region: Secondary | ICD-10-CM

## 2017-02-09 DIAGNOSIS — Z791 Long term (current) use of non-steroidal anti-inflammatories (NSAID): Secondary | ICD-10-CM | POA: Insufficient documentation

## 2017-02-09 DIAGNOSIS — F17210 Nicotine dependence, cigarettes, uncomplicated: Secondary | ICD-10-CM | POA: Diagnosis not present

## 2017-02-09 DIAGNOSIS — F1190 Opioid use, unspecified, uncomplicated: Secondary | ICD-10-CM

## 2017-02-09 DIAGNOSIS — M25561 Pain in right knee: Secondary | ICD-10-CM | POA: Diagnosis not present

## 2017-02-09 DIAGNOSIS — M47896 Other spondylosis, lumbar region: Secondary | ICD-10-CM

## 2017-02-09 DIAGNOSIS — M25512 Pain in left shoulder: Secondary | ICD-10-CM | POA: Diagnosis not present

## 2017-02-09 DIAGNOSIS — Z885 Allergy status to narcotic agent status: Secondary | ICD-10-CM | POA: Diagnosis not present

## 2017-02-09 DIAGNOSIS — K589 Irritable bowel syndrome without diarrhea: Secondary | ICD-10-CM | POA: Insufficient documentation

## 2017-02-09 DIAGNOSIS — G8929 Other chronic pain: Secondary | ICD-10-CM

## 2017-02-09 DIAGNOSIS — M25562 Pain in left knee: Secondary | ICD-10-CM | POA: Insufficient documentation

## 2017-02-09 DIAGNOSIS — M5116 Intervertebral disc disorders with radiculopathy, lumbar region: Secondary | ICD-10-CM | POA: Insufficient documentation

## 2017-02-09 DIAGNOSIS — M545 Low back pain: Secondary | ICD-10-CM | POA: Diagnosis present

## 2017-02-09 DIAGNOSIS — Z79899 Other long term (current) drug therapy: Secondary | ICD-10-CM | POA: Insufficient documentation

## 2017-02-09 DIAGNOSIS — M542 Cervicalgia: Secondary | ICD-10-CM | POA: Diagnosis not present

## 2017-02-09 NOTE — Patient Instructions (Signed)
____________________________________________________________________________________________  Pain Scale  Introduction: The pain score used by this practice is the Verbal Numerical Rating Scale (VNRS-11). This is an 11-point scale. It is for adults and children 10 years or older. There are significant differences in how the pain score is reported, used, and applied. Forget everything you learned in the past and learn this scoring system.  General Information: The scale should reflect your current level of pain. Unless you are specifically asked for the level of your worst pain, or your average pain. If you are asked for one of these two, then it should be understood that it is over the past 24 hours.  Basic Activities of Daily Living (ADL): Personal hygiene, dressing, eating, transferring, and using restroom.  Instructions: Most patients tend to report their level of pain as a combination of two factors, their physical pain and their psychosocial pain. This last one is also known as "suffering" and it is reflection of how physical pain affects you socially and psychologically. From now on, report them separately. From this point on, when asked to report your pain level, report only your physical pain. Use the following table for reference.  Pain Clinic Pain Levels (0-5/10)  Pain Level Score  Description  No Pain 0   Mild pain 1 Nagging, annoying, but does not interfere with basic activities of daily living (ADL). Patients are able to eat, bathe, get dressed, toileting (being able to get on and off the toilet and perform personal hygiene functions), transfer (move in and out of bed or a chair without assistance), and maintain continence (able to control bladder and bowel functions). Blood pressure and heart rate are unaffected. A normal heart rate for a healthy adult ranges from 60 to 100 bpm (beats per minute).   Mild to moderate pain 2 Noticeable and distracting. Impossible to hide from other  people. More frequent flare-ups. Still possible to adapt and function close to normal. It can be very annoying and may have occasional stronger flare-ups. With discipline, patients may get used to it and adapt.   Moderate pain 3 Interferes significantly with activities of daily living (ADL). It becomes difficult to feed, bathe, get dressed, get on and off the toilet or to perform personal hygiene functions. Difficult to get in and out of bed or a chair without assistance. Very distracting. With effort, it can be ignored when deeply involved in activities.   Moderately severe pain 4 Impossible to ignore for more than a few minutes. With effort, patients may still be able to manage work or participate in some social activities. Very difficult to concentrate. Signs of autonomic nervous system discharge are evident: dilated pupils (mydriasis); mild sweating (diaphoresis); sleep interference. Heart rate becomes elevated (>115 bpm). Diastolic blood pressure (lower number) rises above 100 mmHg. Patients find relief in laying down and not moving.   Severe pain 5 Intense and extremely unpleasant. Associated with frowning face and frequent crying. Pain overwhelms the senses.  Ability to do any activity or maintain social relationships becomes significantly limited. Conversation becomes difficult. Pacing back and forth is common, as getting into a comfortable position is nearly impossible. Pain wakes you up from deep sleep. Physical signs will be obvious: pupillary dilation; increased sweating; goosebumps; brisk reflexes; cold, clammy hands and feet; nausea, vomiting or dry heaves; loss of appetite; significant sleep disturbance with inability to fall asleep or to remain asleep. When persistent, significant weight loss is observed due to the complete loss of appetite and sleep deprivation.  Blood   pressure and heart rate becomes significantly elevated. Caution: If elevated blood pressure triggers a pounding headache  associated with blurred vision, then the patient should immediately seek attention at an urgent or emergency care unit, as these may be signs of an impending stroke.    Emergency Department Pain Levels (6-10/10)  Emergency Room Pain 6 Severely limiting. Requires emergency care and should not be seen or managed at an outpatient pain management facility. Communication becomes difficult and requires great effort. Assistance to reach the emergency department may be required. Facial flushing and profuse sweating along with potentially dangerous increases in heart rate and blood pressure will be evident.   Distressing pain 7 Self-care is very difficult. Assistance is required to transport, or use restroom. Assistance to reach the emergency department will be required. Tasks requiring coordination, such as bathing and getting dressed become very difficult.   Disabling pain 8 Self-care is no longer possible. At this level, pain is disabling. The individual is unable to do even the most "basic" activities such as walking, eating, bathing, dressing, transferring to a bed, or toileting. Fine motor skills are lost. It is difficult to think clearly.   Incapacitating pain 9 Pain becomes incapacitating. Thought processing is no longer possible. Difficult to remember your own name. Control of movement and coordination are lost.   The worst pain imaginable 10 At this level, most patients pass out from pain. When this level is reached, collapse of the autonomic nervous system occurs, leading to a sudden drop in blood pressure and heart rate. This in turn results in a temporary and dramatic drop in blood flow to the brain, leading to a loss of consciousness. Fainting is one of the body's self defense mechanisms. Passing out puts the brain in a calmed state and causes it to shut down for a while, in order to begin the healing process.    Summary: 1. Refer to this scale when providing us with your pain level. 2. Be  accurate and careful when reporting your pain level. This will help with your care. 3. Over-reporting your pain level will lead to loss of credibility. 4. Even a level of 1/10 means that there is pain and will be treated at our facility. 5. High, inaccurate reporting will be documented as "Symptom Exaggeration", leading to loss of credibility and suspicions of possible secondary gains such as obtaining more narcotics, or wanting to appear disabled, for fraudulent reasons. 6. Only pain levels of 5 or below will be seen at our facility. 7. Pain levels of 6 and above will be sent to the Emergency Department and the appointment cancelled. ____________________________________________________________________________________________   ____________________________________________________________________________________________  Appointment Policy Summary  It is our goal and responsibility to provide the medical community with assistance in the evaluation and management of patients with chronic pain. Unfortunately our resources are limited. Because we do not have an unlimited amount of time, or available appointments, we are required to closely monitor and manage their use. The following rules exist to maximize their use:  Patient's responsibilities: 1. Punctuality:  At what time should I arrive? You should be physically present in our office 30 minutes before your scheduled appointment. Your scheduled appointment is with your assigned healthcare provider. However, it takes 5-10 minutes to be "checked-in", and another 15 minutes for the nurses to do the admission. If you arrive to our office at the time you were given for your appointment, you will end up being at least 20-25 minutes late to your appointment with the   provider. 2. Tardiness:  What happens if I arrive only a few minutes after my scheduled appointment time? You will need to reschedule your appointment. The cutoff is your appointment time.  This is why it is so important that you arrive at least 30 minutes before that appointment. If you have an appointment scheduled for 10:00 AM and you arrive at 10:01, you will be required to reschedule your appointment.  3. Plan ahead:  Always assume that you will encounter traffic on your way in. Plan for it. If you are dependent on a driver, make sure they understand these rules and the need to arrive early. 4. Other appointments and responsibilities:  Avoid scheduling any other appointments before or after your pain clinic appointments.  5. Be prepared:  Write down everything that you need to discuss with your healthcare provider and give this information to the admitting nurse. Write down the medications that you will need refilled. Bring your pills and bottles (even the empty ones), to all of your appointments, except for those where a procedure is scheduled. 6. No children or pets:  Find someone to take care of them. It is not appropriate to bring them in. 7. Scheduling changes:  We request "advanced notification" of any changes or cancellations. 8. Advanced notification:  Defined as a time period of more than 24 hours prior to the originally scheduled appointment. This allows for the appointment to be offered to other patients. 9. Rescheduling:  When a visit is rescheduled, it will require the cancellation of the original appointment. For this reason they both fall within the category of "Cancellations".  10. Cancellations:  They require advanced notification. Any cancellation less than 24 hours before the  appointment will be recorded as a "No Show". 11. No Show:  Defined as an unkept appointment where the patient failed to notify or declare to the practice their intention or inability to keep the appointment.  Corrective process for repeat offenders:  1. Tardiness: Three (3) episodes of rescheduling due to late arrivals will be recorded as one (1) "No Show". 2. Cancellation or  reschedule: Three (3) cancellations or rescheduling will be recorded as one (1) "No Show". 3. "No Shows": Three (3) "No Shows" within a 12 month period will result in discharge from the practice.  ____________________________________________________________________________________________   

## 2017-02-12 LAB — COMPLIANCE DRUG ANALYSIS, UR

## 2017-02-15 LAB — COMPREHENSIVE METABOLIC PANEL
ALT: 50 IU/L — ABNORMAL HIGH (ref 0–32)
AST: 35 IU/L (ref 0–40)
Albumin/Globulin Ratio: 1.1 — ABNORMAL LOW (ref 1.2–2.2)
Albumin: 3.9 g/dL (ref 3.5–5.5)
Alkaline Phosphatase: 95 IU/L (ref 39–117)
BUN / CREAT RATIO: 18 (ref 9–23)
BUN: 14 mg/dL (ref 6–24)
CO2: 26 mmol/L (ref 20–29)
CREATININE: 0.76 mg/dL (ref 0.57–1.00)
Calcium: 9.8 mg/dL (ref 8.7–10.2)
Chloride: 98 mmol/L (ref 96–106)
GFR calc non Af Amer: 98 mL/min/{1.73_m2} (ref >59)
GFR, EST AFRICAN AMERICAN: 113 mL/min/{1.73_m2} (ref >59)
GLOBULIN, TOTAL: 3.5 g/dL (ref 1.5–4.5)
GLUCOSE: 148 mg/dL — AB (ref 65–99)
Potassium: 4.7 mmol/L (ref 3.5–5.2)
SODIUM: 138 mmol/L (ref 134–144)
TOTAL PROTEIN: 7.4 g/dL (ref 6.0–8.5)

## 2017-02-15 LAB — 25-HYDROXYVITAMIN D LCMS D2+D3
25-HYDROXY, VITAMIN D-2: 1.1 ng/mL
25-HYDROXY, VITAMIN D: 18 ng/mL — AB

## 2017-02-15 LAB — C-REACTIVE PROTEIN: CRP: 34.2 mg/L — ABNORMAL HIGH (ref 0.0–4.9)

## 2017-02-15 LAB — SEDIMENTATION RATE: Sed Rate: 19 mm/hr (ref 0–32)

## 2017-02-15 LAB — 25-HYDROXY VITAMIN D LCMS D2+D3: 25-Hydroxy, Vitamin D-3: 17 ng/mL

## 2017-02-15 LAB — VITAMIN B12: VITAMIN B 12: 568 pg/mL (ref 232–1245)

## 2017-02-15 LAB — MAGNESIUM: Magnesium: 1.8 mg/dL (ref 1.6–2.3)

## 2017-03-11 ENCOUNTER — Encounter: Payer: Self-pay | Admitting: Obstetrics and Gynecology

## 2017-03-15 ENCOUNTER — Ambulatory Visit: Payer: Medicaid Other | Admitting: Obstetrics and Gynecology

## 2017-03-23 ENCOUNTER — Ambulatory Visit: Payer: Medicaid Other | Admitting: Obstetrics and Gynecology

## 2017-04-19 ENCOUNTER — Telehealth: Payer: Self-pay

## 2017-04-19 NOTE — Telephone Encounter (Signed)
Pt is schedule 04/25/17 with ABC

## 2017-04-19 NOTE — Telephone Encounter (Signed)
Pt cancelled appointment in September for [facility] results. Please call and schedule with [facility]. Thank you.

## 2017-04-25 ENCOUNTER — Encounter: Payer: Self-pay | Admitting: Obstetrics and Gynecology

## 2017-04-25 ENCOUNTER — Ambulatory Visit (INDEPENDENT_AMBULATORY_CARE_PROVIDER_SITE_OTHER): Payer: Medicaid Other | Admitting: Obstetrics and Gynecology

## 2017-04-25 VITALS — BP 158/80 | HR 88 | Ht 66.0 in | Wt 362.0 lb

## 2017-04-25 DIAGNOSIS — Z7183 Encounter for nonprocreative genetic counseling: Secondary | ICD-10-CM | POA: Diagnosis not present

## 2017-04-25 DIAGNOSIS — A630 Anogenital (venereal) warts: Secondary | ICD-10-CM

## 2017-04-25 DIAGNOSIS — Z1379 Encounter for other screening for genetic and chromosomal anomalies: Secondary | ICD-10-CM

## 2017-04-25 DIAGNOSIS — Z1371 Encounter for nonprocreative screening for genetic disease carrier status: Secondary | ICD-10-CM

## 2017-04-25 NOTE — [place] (Signed)
Chief Complaint  Patient presents with  . Results    My Risk Results    HPI:   [name] is a 41 y.o. [id number] who LMP was Patient's last menstrual period was 04/13/2017., presents today for  My Risk cancer genetic testing results.  Results are negative for a cancer genetic mutation. There is a BRCA mutation in her affected mat aunt's daughter (mat aunt and pt's mom deceased).   IBIS=18 % by my calculation. Myriad calculation didn't know there was a BRCA mutation in the family and pt is neg.  Last mammogram: getting yearly since 59s Vitamin D status: takes supp  Pt is also here to f/u on TCA tx of ext genital warts from 7/18. Pt had 2 large warts in RT perineal area. Pt states 1 wart is gone and the other is smaller. She would like tx again.   Patient Active Problem List   Diagnosis Date Noted  . BRCA negative 04/25/2017  . Chronic lower extremity pain (Secondary Area of Pain) (Bilateral) (R>L) 02/09/2017  . Chronic knee pain [facility] Area of Pain) (Bilateral) (R>L) 02/09/2017  . Chronic hip pain (Fourth Area of Pain) (Bilateral) (R>L) 02/09/2017  . Chronic shoulder pain (Fifth Area of Pain) (Bilateral) (R>L) 02/09/2017  . Chronic neck pain (Bilateral) (R>L) 02/09/2017  . NSAID long-term use 02/09/2017  . Osteoarthritis of knee 02/08/2017  . Chronic pain syndrome 02/08/2017  . Opiate use 02/08/2017    Class: History of  . 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 Lumbar facet hypertrophy (Bilateral) 02/08/2017  . Chronic lumbar radiculitis of L3 (Left) 02/08/2017  . Chronic low back pain (Primary Area of Pain) (Bilateral) (L>R) 02/08/2017  . Morbid obesity with BMI of 50.0-59.9, adult ([place]) 02/08/2017  . Family history of breast cancer 02/01/2017  . 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) DNA test positive 02/01/2017  . Family history of BRCA gene positive 02/01/2017  . Dyspepsia 03/26/2014  . IBS (irritable bowel syndrome) 03/26/2014     Family History  Problem Relation Age of Onset  . Breast cancer Mother 63  . Diabetes Mother   . Breast cancer Sister 45  . Breast cancer Maternal Aunt 34  . Ovarian cancer Maternal Aunt 47  . Breast cancer Other 26       BRCA pos (daughter of affected aunt with breast cancer)  . Diabetes Father   . Kidney disease Father     Vitals:   04/25/17 1448  BP: (!) 158/80  Pulse: 88   Review of Systems  Constitutional: Negative for fever, malaise/fatigue and weight loss.  Gastrointestinal: Negative for blood in stool, constipation, diarrhea, nausea and vomiting.  Genitourinary: Negative for dysuria, flank pain, frequency, hematuria and urgency.  Musculoskeletal: Negative for back pain.  Skin: Negative for itching and rash.     Physical Exam  Constitutional: She is oriented to person, place, and time and well-developed, well-nourished, and in no distress. Vital signs are normal.  Genitourinary: Vulva exhibits lesion. Vulva exhibits no erythema, no exudate, no rash and no tenderness. Vagina exhibits no lesion.  Genitourinary Comments: 1 LARGE EXT WART, ~2 CM IN SIZE; 2ND WARTY LESION IS GONE  Neurological: She is oriented to person, place, and time.  Vitals reviewed.    Assessment/Plan: Genetic testing of female - Pt is MyRisk neg. Affected sister's daughter is BRCA pos. Since pt is neg, her risk is less than 20%. Cont yearly mammos and monthly SBE.  BRCA negative  Genital  warts - TCA tx applied to 1 remaining wart. Wash in 4-6 hrs. F/u prn.    Recommended monthly SBE, yearly CBE, yearly mammos and add Vit D3 2000 IU dialy.  Patient understands these results only apply to her and her children, and this is not indicative of genetic testing results of her other family members. It is recommended that her other family members have genetic testing done.  Pt also understands negative genetic testing doesn't mean she will never get any of these cancers.   Results handout given to  pt.  64  Min spent in direct contact with pt re: counseling/coordination of care.   Seanmichael Salmons B. Marcine Gadway, PA-C  04/25/2017 7:00 PM

## 2017-11-16 ENCOUNTER — Ambulatory Visit: Payer: Medicaid Other | Admitting: Obstetrics and Gynecology

## 2017-11-22 ENCOUNTER — Other Ambulatory Visit: Payer: Self-pay

## 2017-11-22 ENCOUNTER — Encounter: Payer: Self-pay | Admitting: Obstetrics and Gynecology

## 2017-11-22 ENCOUNTER — Ambulatory Visit (INDEPENDENT_AMBULATORY_CARE_PROVIDER_SITE_OTHER): Payer: Medicaid Other | Admitting: Obstetrics and Gynecology

## 2017-11-22 VITALS — BP 126/78 | HR 105 | Ht 66.0 in | Wt 386.0 lb

## 2017-11-22 DIAGNOSIS — Z789 Other specified health status: Secondary | ICD-10-CM

## 2017-11-22 DIAGNOSIS — N898 Other specified noninflammatory disorders of vagina: Secondary | ICD-10-CM | POA: Diagnosis not present

## 2017-11-22 DIAGNOSIS — Z113 Encounter for screening for infections with a predominantly sexual mode of transmission: Secondary | ICD-10-CM

## 2017-11-22 LAB — POCT WET PREP WITH KOH
CLUE CELLS WET PREP PER HPF POC: NEGATIVE
KOH PREP POC: NEGATIVE
Trichomonas, UA: NEGATIVE
Yeast Wet Prep HPF POC: NEGATIVE

## 2017-11-22 LAB — POCT URINE PREGNANCY: PREG TEST UR: NEGATIVE

## 2017-11-22 NOTE — Patient Instructions (Signed)
I value your feedback and entrusting us with your care. If you get a [facility] patient survey, I would appreciate you taking the time to let us know about your experience today. Thank you! 

## 2017-11-22 NOTE — Progress Notes (Signed)
[name], MD   Chief Complaint  Patient presents with  . Follow-up    STD testing. Condom broke during intercourse    HPI:      [name] is a 42 y.o. [id number] who LMP was Patient's last menstrual period was 10/26/2017., presents today for STD testing after condom broke 11/11/17. Pt would also like UPT. She has noticed some increased d/c, mild odor, but no itching. No known exposures but wants to be safe. Ext warts from 7/18 resolved with TCA tx.  Having trouble with hemorrhoids and rectal bleeding. Needs to f/u with PCP.   Past Medical History:  Diagnosis Date  . BRCA negative 02/2017   MyRisk neg; IBIS=18% per my calculation; BRCA gene pos in family, pt is neg.  . 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) DNA test positive   . COPD (chronic obstructive pulmonary disease) ([place])   . Diabetes mellitus without complication ([place])   . Family history of BRCA gene mutation    mat side  . Family history of breast cancer   . Family history of ovarian cancer   . Genital warts   . LGSIL on Pap smear of cervix 2014  . Obesity   . Sleep apnea     Past Surgical History:  Procedure Laterality Date  . GALLBLADDER SURGERY      Family History  Problem Relation Age of Onset  . Breast cancer Mother 62  . Diabetes Mother   . Breast cancer Sister 23  . Breast cancer Maternal Aunt 59  . Ovarian cancer Maternal Aunt 55  . Breast cancer Other 26       BRCA pos (daughter of affected aunt with breast cancer)  . Diabetes Father   . Kidney disease Father     Social History   Socioeconomic History  . Marital status: Single    Spouse name: Not on file  . Number of children: Not on file  . Years of education: Not on file  . Highest education level: Not on file  Occupational History  . Not on file  Social Needs  . Financial resource strain: Not on file  . Food insecurity:    Worry: Not on file    Inability: Not on file  . Transportation needs:    Medical:  Not on file    Non-medical: Not on file  Tobacco Use  . Smoking status: Current Every Day Smoker    Packs/day: 0.50  . Smokeless tobacco: Never Used  Substance and Sexual Activity  . Alcohol use: Yes    Comment: occassional  . Drug use: No  . Sexual activity: Never    Birth control/protection: None  Lifestyle  . Physical activity:    Days per week: Not on file    Minutes per session: Not on file  . Stress: Not on file  Relationships  . Social connections:    Talks on phone: Not on file    Gets together: Not on file    Attends religious service: Not on file    Active member of club or organization: Not on file    Attends meetings of clubs or organizations: Not on file    Relationship status: Not on file  . Intimate partner violence:    Fear of current or ex partner: Not on file    Emotionally abused: Not on file    Physically abused: Not on file    Forced sexual activity: Not on file  Other Topics  Concern  . Not on file  Social History Narrative  . Not on file    Outpatient Medications Prior to Visit  Medication Sig Dispense Refill  . albuterol (ACCUNEB) 0.63 MG/3ML nebulizer solution Take 1 ampule by nebulization every 6 (six) hours as needed for wheezing.    Marland Kitchen albuterol (PROVENTIL HFA;VENTOLIN HFA) 108 (90 Base) MCG/ACT inhaler Inhale 2 puffs into the lungs every 6 (six) hours as needed for wheezing or shortness of breath.    . cyclobenzaprine (FLEXERIL) 10 MG tablet Take 10 mg by mouth 3 (three) times daily.     Marland Kitchen FLUoxetine (PROZAC) 40 MG capsule Take 40 mg by mouth daily.     Marland Kitchen gabapentin (NEURONTIN) 300 MG capsule Take 300 mg by mouth 3 (three) times daily.     . Lisinopril 1 MG/ML SOLN lisinopril    . metFORMIN (GLUCOPHAGE) 1000 MG tablet Take 1,000 mg by mouth 2 (two) times daily with a meal.    . pioglitazone (ACTOS) 15 MG tablet Take 15 mg by mouth daily.    Marland Kitchen rOPINIRole (REQUIP) 0.25 MG tablet Take 0.25 mg by mouth 3 (three) times daily.    . traZODone  (DESYREL) 50 MG tablet Take 50 mg by mouth at bedtime.    Marland Kitchen acetaminophen (ACETAMINOPHEN 8 HOUR) 650 MG CR tablet Take 650 mg by mouth every 8 (eight) hours as needed for pain.    . ciprofloxacin (CIPRO) 500 MG tablet Take 500 mg by mouth daily.    . clindamycin (CLEOCIN T) 1 % lotion Apply 1 capsule topically 2 (two) times daily.    . clindamycin (CLEOCIN) 300 MG capsule Take 300 mg by mouth 2 (two) times daily.    Marland Kitchen lisinopril-hydrochlorothiazide (PRINZIDE,ZESTORETIC) 10-12.5 MG tablet Take 1 tablet by mouth daily.    . medroxyPROGESTERone (PROVERA) 10 MG tablet Take 10 mg by mouth daily.    . meloxicam (MOBIC) 15 MG tablet Take by mouth.    . METFORMIN & DIET MANAGE PROD PO metformin    . naproxen (NAPROSYN) 500 MG tablet Take 500 mg by mouth 2 (two) times daily with a meal.    . tiotropium (SPIRIVA) 18 MCG inhalation capsule Place 18 mcg into inhaler and inhale daily.     No facility-administered medications prior to visit.      ROS:  Review of Systems  Constitutional: Negative for fever.  Gastrointestinal: Positive for blood in stool. Negative for constipation, diarrhea, nausea and vomiting.  Genitourinary: Positive for vaginal discharge. Negative for dyspareunia, dysuria, flank pain, frequency, hematuria, urgency, vaginal bleeding and vaginal pain.  Musculoskeletal: Negative for back pain.  Skin: Negative for rash.     OBJECTIVE:   Vitals:  BP 126/78 (BP Location: Left Arm, Patient Position: Sitting, Cuff Size: Large)   Pulse (!) 105   Ht _0  (1.676 m)   Wt (!) 386 lb (175.1 kg)   LMP 10/26/2017   BMI 62.30 kg/m   Physical Exam  Constitutional: She is oriented to person, place, and time. Vital signs are normal. She appears well-developed.  Pulmonary/Chest: Effort normal.  Genitourinary: Vagina normal and uterus normal. There is no rash, tenderness or lesion on the right labia. There is no rash, tenderness or lesion on the left labia. Uterus is not tender. Cervix  exhibits no motion tenderness. Right adnexum displays no tenderness. Left adnexum displays no tenderness. No erythema or tenderness in the vagina. No vaginal discharge found.  Musculoskeletal: Normal range of motion.  Neurological: She is alert and oriented  to person, place, and time.  Psychiatric: She has a normal mood and affect. Her behavior is normal. Thought content normal.  Vitals reviewed.   Results: Results for orders placed or performed in visit on 11/22/17 (from the past 24 hour(s))  POCT urine pregnancy     Status: Normal   Collection Time: 11/22/17 10:09 AM  Result Value Ref Range   Preg Test, Ur Negative Negative  POCT Wet Prep with KOH     Status: Normal   Collection Time: 11/22/17 10:10 AM  Result Value Ref Range   Trichomonas, UA Negative    Clue Cells Wet Prep HPF POC neg    Epithelial Wet Prep HPF POC  Few, Moderate, Many, Too numerous to count   Yeast Wet Prep HPF POC neg    Bacteria Wet Prep HPF POC  Few   RBC Wet Prep HPF POC     WBC Wet Prep HPF POC     KOH Prep POC Negative Negative     Assessment/Plan: Screening for STD (sexually transmitted disease) - Will call with results - Plan: HIV antibody, Hepatitis C antibody, HSV 2 antibody, IgG, Chlamydia/Gonococcus/Trichomonas, NAA, RPR  Vaginal discharge - Neg wet prep/exam. Check gon/chlam. If neg, f/u prn. - Plan: POCT Wet Prep with KOH  Problem with condom - Neg UPT. Check again if no menses when due later this wk. - Plan: POCT urine pregnancy    Return if symptoms worsen or fail to improve.  Alicia B. Copland, PA-C 11/22/2017 10:11 AM

## 2017-11-23 LAB — HSV 2 ANTIBODY, IGG

## 2017-11-23 LAB — RPR: RPR Ser Ql: NONREACTIVE

## 2017-11-23 LAB — HIV ANTIBODY (ROUTINE TESTING W REFLEX): HIV Screen 4th Generation wRfx: NONREACTIVE

## 2017-11-23 LAB — HEPATITIS C ANTIBODY

## 2017-11-24 LAB — CHLAMYDIA/GONOCOCCUS/TRICHOMONAS, NAA
Chlamydia by NAA: NEGATIVE
GONOCOCCUS BY NAA: NEGATIVE
TRICH VAG BY NAA: NEGATIVE

## 2018-03-23 ENCOUNTER — Ambulatory Visit (INDEPENDENT_AMBULATORY_CARE_PROVIDER_SITE_OTHER): Payer: Medicaid Other | Admitting: Obstetrics and Gynecology

## 2018-03-23 ENCOUNTER — Encounter: Payer: Self-pay | Admitting: Obstetrics and Gynecology

## 2018-03-23 ENCOUNTER — Other Ambulatory Visit (HOSPITAL_COMMUNITY)
Admission: RE | Admit: 2018-03-23 | Discharge: 2018-03-23 | Disposition: A | Discharge status: Home / Self Care | Payer: Medicaid Other | Source: Ambulatory Visit | Attending: Obstetrics and Gynecology | Admitting: Obstetrics and Gynecology

## 2018-03-23 VITALS — BP 150/70 | HR 97 | Ht 66.0 in | Wt 370.0 lb

## 2018-03-23 DIAGNOSIS — Z1239 Encounter for other screening for malignant neoplasm of breast: Secondary | ICD-10-CM

## 2018-03-23 DIAGNOSIS — Z0001 Encounter for general adult medical examination with abnormal findings: Secondary | ICD-10-CM

## 2018-03-23 DIAGNOSIS — Z113 Encounter for screening for infections with a predominantly sexual mode of transmission: Secondary | ICD-10-CM

## 2018-03-23 DIAGNOSIS — R3915 Urgency of urination: Secondary | ICD-10-CM

## 2018-03-23 DIAGNOSIS — Z124 Encounter for screening for malignant neoplasm of cervix: Secondary | ICD-10-CM | POA: Diagnosis not present

## 2018-03-23 DIAGNOSIS — Z1231 Encounter for screening mammogram for malignant neoplasm of breast: Secondary | ICD-10-CM

## 2018-03-23 DIAGNOSIS — A630 Anogenital (venereal) warts: Secondary | ICD-10-CM | POA: Insufficient documentation

## 2018-03-23 DIAGNOSIS — Z01419 Encounter for gynecological examination (general) (routine) without abnormal findings: Secondary | ICD-10-CM

## 2018-03-23 LAB — POCT URINALYSIS DIPSTICK
BILIRUBIN UA: NEGATIVE
Glucose, UA: NEGATIVE
Ketones, UA: NEGATIVE
LEUKOCYTES UA: NEGATIVE
NITRITE UA: NEGATIVE
PH UA: 6 (ref 5.0–8.0)
PROTEIN UA: NEGATIVE
Spec Grav, UA: 1.01 (ref 1.010–1.025)

## 2018-03-23 NOTE — Progress Notes (Signed)
PCP:  [name], MD   Chief Complaint  Patient presents with  . Gynecologic Exam    Follow up and annual, also pt thinks she might have a UTI: sudden urge to go urinate, burning sensation     HPI:      Amy Cisneros is a 42 y.o. [id number] who LMP was Patient's last menstrual period was 03/19/2018 (exact date)., presents today for her annual examination.  Her menses are regular every 28-30 days, lasting 4 days.  Dysmenorrhea mild, occurring first 1-2 days of flow. She does not have intermenstrual bleeding.  Sex activity: single partner, contraception - condoms. Declines other BC. Condom broke recently. Normal menses since. Wants STD testing   Last Pap: February 01, 2017  Results were: no abnormalities /neg HPV DNA. Hx of mult abn paps. 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 Has been treated for ext warts with TCA 7/18 and 5/19. Pt states lesions resolve or get smaller. Has several new ones again and wants treated. Also has seen derm in past.   Last mammogram: yearly at [facility] per pt report. There is a strong FH of breast cancer on her mat side (mom, sister, mat aunt, niece). Affected niece is BRCA pos, pt is MyRisk neg 2018. IBIS=18%. There is a FH of ovarian cancer in her mat aunt. The patient does do self-breast exams.  Tobacco use: The patient currently smokes 1/2 packs of cigarettes per day for the past many years. Alcohol use: none No drug use.  Exercise: not active  She does get adequate calcium and Vitamin D in her diet.   Past Medical History:  Diagnosis Date  . BRCA negative 02/2017   MyRisk neg; IBIS=18% per my calculation; BRCA gene pos in family, pt is neg.  . 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) DNA test positive   . COPD (chronic obstructive pulmonary disease) ([place])   . Diabetes mellitus without complication ([place])   . Family history of BRCA gene mutation    mat side  . Family history of breast cancer   . Family history of ovarian cancer   . Genital warts    . LGSIL on Pap smear of cervix 2014  . Obesity   . Sleep apnea     Past Surgical History:  Procedure Laterality Date  . GALLBLADDER SURGERY      Family History  Problem Relation Age of Onset  . Breast cancer Mother 83  . Diabetes Mother   . Breast cancer Sister 62  . Breast cancer Maternal Aunt 65  . Ovarian cancer Maternal Aunt 15  . Breast cancer Other 26       BRCA pos (daughter of affected aunt with breast cancer)  . Diabetes Father   . Kidney disease Father     Social History   Socioeconomic History  . Marital status: Single    Spouse name: Not on file  . Number of children: Not on file  . Years of education: Not on file  . Highest education level: Not on file  Occupational History  . Not on file  Social Needs  . Financial resource strain: Not on file  . Food insecurity:    Worry: Not on file    Inability: Not on file  . Transportation needs:    Medical: Not on file    Non-medical: Not on file  Tobacco Use  . Smoking status: Current Every Day Smoker    Packs/day: 0.50  . Smokeless tobacco: Never  Used  Substance and Sexual Activity  . Alcohol use: Yes    Comment: occassional  . Drug use: No  . Sexual activity: Yes    Birth control/protection: Condom  Lifestyle  . Physical activity:    Days per week: Not on file    Minutes per session: Not on file  . Stress: Not on file  Relationships  . Social connections:    Talks on phone: Not on file    Gets together: Not on file    Attends religious service: Not on file    Active member of club or organization: Not on file    Attends meetings of clubs or organizations: Not on file    Relationship status: Not on file  . Intimate partner violence:    Fear of current or ex partner: Not on file    Emotionally abused: Not on file    Physically abused: Not on file    Forced sexual activity: Not on file  Other Topics Concern  . Not on file  Social History Narrative  . Not on file    Outpatient Medications  Prior to Visit  Medication Sig Dispense Refill  . albuterol (ACCUNEB) 0.63 MG/3ML nebulizer solution Take 1 ampule by nebulization every 6 (six) hours as needed for wheezing.    Marland Kitchen albuterol (PROVENTIL HFA;VENTOLIN HFA) 108 (90 Base) MCG/ACT inhaler Inhale 2 puffs into the lungs every 6 (six) hours as needed for wheezing or shortness of breath.    . cyclobenzaprine (FLEXERIL) 10 MG tablet Take 10 mg by mouth 3 (three) times daily.     Marland Kitchen FLUoxetine (PROZAC) 40 MG capsule Take 40 mg by mouth daily.     Marland Kitchen gabapentin (NEURONTIN) 300 MG capsule Take 300 mg by mouth 3 (three) times daily.     Marland Kitchen lisinopril-hydrochlorothiazide (PRINZIDE,ZESTORETIC) 10-12.5 MG tablet Take 1 tablet by mouth daily.    . meloxicam (MOBIC) 15 MG tablet Take by mouth.    . METFORMIN & DIET MANAGE PROD PO metformin    . metFORMIN (GLUCOPHAGE) 1000 MG tablet Take 1,000 mg by mouth 2 (two) times daily with a meal.    . pioglitazone (ACTOS) 15 MG tablet Take 15 mg by mouth daily.    Marland Kitchen rOPINIRole (REQUIP) 0.25 MG tablet Take 0.25 mg by mouth 3 (three) times daily.    . traZODone (DESYREL) 50 MG tablet Take 50 mg by mouth at bedtime.    Marland Kitchen acetaminophen (ACETAMINOPHEN 8 HOUR) 650 MG CR tablet Take 650 mg by mouth every 8 (eight) hours as needed for pain.    . ciprofloxacin (CIPRO) 500 MG tablet Take 500 mg by mouth daily.    . clindamycin (CLEOCIN T) 1 % lotion Apply 1 capsule topically 2 (two) times daily.    . clindamycin (CLEOCIN) 300 MG capsule Take 300 mg by mouth 2 (two) times daily.    . Lisinopril 1 MG/ML SOLN lisinopril    . medroxyPROGESTERone (PROVERA) 10 MG tablet Take 10 mg by mouth daily.    . naproxen (NAPROSYN) 500 MG tablet Take 500 mg by mouth 2 (two) times daily with a meal.    . tiotropium (SPIRIVA) 18 MCG inhalation capsule Place 18 mcg into inhaler and inhale daily.     No facility-administered medications prior to visit.      ROS:  Review of Systems  Constitutional: Negative for fatigue, fever and  unexpected weight change.  Respiratory: Positive for shortness of breath and wheezing. Negative for cough.   Cardiovascular: Negative  for chest pain, palpitations and leg swelling.  Gastrointestinal: Positive for constipation, diarrhea, nausea and vomiting. Negative for blood in stool.  Endocrine: Negative for cold intolerance, heat intolerance and polyuria.  Genitourinary: Positive for dysuria, frequency, urgency and vaginal bleeding. Negative for dyspareunia, flank pain, genital sores, hematuria, menstrual problem, pelvic pain, vaginal discharge and vaginal pain.  Musculoskeletal: Positive for arthralgias. Negative for back pain, joint swelling and myalgias.  Skin: Negative for rash.  Neurological: Negative for dizziness, syncope, light-headedness, numbness and headaches.  Hematological: Negative for adenopathy.  Psychiatric/Behavioral: Negative for agitation, confusion, sleep disturbance and suicidal ideas. The patient is not nervous/anxious.   BREAST: No symptoms   Objective: BP (!) 150/70   Pulse 97   Ht '5\' 6"'  (1.676 m)   Wt (!) 370 lb (167.8 kg)   LMP 03/19/2018 (Exact Date)   BMI 59.72 kg/m    Physical Exam  Constitutional: She is oriented to person, place, and time. She appears well-developed and well-nourished.  Genitourinary: Uterus normal. There is no rash or tenderness on the right labia. There is no rash or tenderness on the left labia.    There is bleeding in the vagina. No erythema or tenderness in the vagina. No vaginal discharge found. Right adnexum does not display tenderness. Left adnexum does not display tenderness. Cervix does not exhibit motion tenderness or polyp.  Neck: Normal range of motion. No thyromegaly present.  Cardiovascular: Normal rate, regular rhythm and normal heart sounds.  No murmur heard. Pulmonary/Chest: Effort normal and breath sounds normal. Right breast exhibits no mass, no nipple discharge, no skin change and no tenderness. Left breast  exhibits no mass, no nipple discharge, no skin change and no tenderness.  Abdominal: Soft. There is no tenderness. There is no guarding.  Musculoskeletal: Normal range of motion.  Neurological: She is alert and oriented to person, place, and time. No cranial nerve deficit.  Psychiatric: She has a normal mood and affect. Her behavior is normal.  Vitals reviewed.   Results: Results for orders placed or performed in visit on 03/23/18 (from the past 24 hour(s))  POCT Urinalysis Dipstick     Status: Normal   Collection Time: 03/23/18  2:22 PM  Result Value Ref Range   Color, UA ORANGE    Clarity, UA clear    Glucose, UA Negative Negative   Bilirubin, UA neg    Ketones, UA neg    Spec Grav, UA 1.010 1.010 - 1.025   Blood, UA large    pH, UA 6.0 5.0 - 8.0   Protein, UA Negative Negative   Urobilinogen, UA     Nitrite, UA neg    Leukocytes, UA Negative Negative   Appearance     Odor    PT WITH MENSTRUAL BLEEDING    Assessment/Plan: Encounter for annual routine gynecological examination  Cervical cancer screening - Plan: Cytology - PAP, CANCELED: Cytology - PAP  Screening for STD (sexually transmitted disease) - Plan: HIV Antibody (routine testing w rflx)  Screening for breast cancer - Pt current on mammo at [facility]: MM 3D SCREEN BREAST BILATERAL  Urinary urgency - Neg dip. Check C&S. Will call with results. D/C caffeine. D/C AZO - Plan: POCT Urinalysis Dipstick, Urine Culture  Genital warts - Treated wtih TCA. Wash in 4-6 hrs. Wound care. F/u with derm for further tx options if sx persist.      GYN counsel breast self exam, mammography screening, adequate intake of calcium and vitamin D, diet and exercise  F/U  Return in about 1 year (around 03/24/2019).  Elease Swarm B. Myriam Brandhorst, PA-C 03/23/2018 2:24 PM

## 2018-03-23 NOTE — Patient Instructions (Signed)
I value your feedback and entrusting us with your care. If you get a [facility] patient survey, I would appreciate you taking the time to let us know about your experience today. Thank you!  [facility]: [phone]    

## 2018-03-24 LAB — CYTOLOGY - PAP
CHLAMYDIA, DNA PROBE: NEGATIVE
Diagnosis: NEGATIVE
NEISSERIA GONORRHEA: NEGATIVE
Trichomonas: NEGATIVE

## 2018-03-24 LAB — HIV ANTIBODY (ROUTINE TESTING W REFLEX): HIV SCREEN 4TH GENERATION: NONREACTIVE

## 2018-03-25 LAB — URINE CULTURE

## 2018-07-24 ENCOUNTER — Ambulatory Visit: Payer: Medicaid Other | Admitting: Obstetrics and Gynecology

## 2018-08-07 ENCOUNTER — Ambulatory Visit: Payer: Medicaid Other | Admitting: Obstetrics and Gynecology

## 2018-08-14 ENCOUNTER — Encounter: Payer: Self-pay | Admitting: Obstetrics and Gynecology

## 2018-08-14 ENCOUNTER — Ambulatory Visit (INDEPENDENT_AMBULATORY_CARE_PROVIDER_SITE_OTHER): Payer: Medicaid Other | Admitting: Obstetrics and Gynecology

## 2018-08-14 ENCOUNTER — Other Ambulatory Visit (HOSPITAL_COMMUNITY)
Admission: RE | Admit: 2018-08-14 | Discharge: 2018-08-14 | Disposition: A | Discharge status: Home / Self Care | Payer: Medicaid Other | Source: Ambulatory Visit | Attending: Obstetrics and Gynecology | Admitting: Obstetrics and Gynecology

## 2018-08-14 VITALS — HR 106 | Ht 66.0 in | Wt 350.0 lb

## 2018-08-14 DIAGNOSIS — A630 Anogenital (venereal) warts: Secondary | ICD-10-CM

## 2018-08-14 DIAGNOSIS — Z113 Encounter for screening for infections with a predominantly sexual mode of transmission: Secondary | ICD-10-CM | POA: Diagnosis present

## 2018-08-14 MED ORDER — IMIQUIMOD 5 % EX CREA: TOPICAL_CREAM | CUTANEOUS | 1 refills | Status: DC

## 2018-08-14 NOTE — Patient Instructions (Signed)
I value your feedback and entrusting us with your care. If you get a [facility] patient survey, I would appreciate you taking the time to let us know about your experience today. Thank you! 

## 2018-08-14 NOTE — Progress Notes (Signed)
Amy Market, MD   Chief Complaint  Patient presents with  . STD screening    HPI:      [name] is a 43 y.o. [id number] who LMP was Patient's last menstrual period was 07/18/2018 (exact date)., presents today for STD testing. Pt had a new partner and condom broke. No sx, no known exposures.  Pt also with hx of ext genital warts, treated several times with TCA without much change. Pt is seeing derm for HS, but hasn't discussed wart tx yet, as suggested at last appt.    Past Medical History:  Diagnosis Date  . BRCA negative 02/2017   MyRisk neg; IBIS=18% per my calculation; BRCA gene pos in family, pt is neg.  . 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) DNA test positive   . COPD (chronic obstructive pulmonary disease) ([place])   . Diabetes mellitus without complication ([place])   . Family history of BRCA gene mutation    mat side  . Family history of breast cancer   . Family history of ovarian cancer   . Genital warts   . LGSIL on Pap smear of cervix 2014  . Obesity   . Sleep apnea     Past Surgical History:  Procedure Laterality Date  . GALLBLADDER SURGERY      Family History  Problem Relation Age of Onset  . Breast cancer Mother 43  . Diabetes Mother   . Breast cancer Sister 54  . Breast cancer Maternal Aunt 49  . Ovarian cancer Maternal Aunt 10  . Breast cancer Other 26       BRCA pos (daughter of affected aunt with breast cancer)  . Diabetes Father   . Kidney disease Father     Social History   Socioeconomic History  . Marital status: Single    Spouse name: Not on file  . Number of children: Not on file  . Years of education: Not on file  . Highest education level: Not on file  Occupational History  . Not on file  Social Needs  . Financial resource strain: Not on file  . Food insecurity:    Worry: Not on file    Inability: Not on file  . Transportation needs:    Medical: Not on file    Non-medical: Not on file  Tobacco Use  .  Smoking status: Current Every Day Smoker    Packs/day: 0.50  . Smokeless tobacco: Never Used  Substance and Sexual Activity  . Alcohol use: Yes    Comment: occassional  . Drug use: No  . Sexual activity: Yes    Birth control/protection: Condom  Lifestyle  . Physical activity:    Days per week: Not on file    Minutes per session: Not on file  . Stress: Not on file  Relationships  . Social connections:    Talks on phone: Not on file    Gets together: Not on file    Attends religious service: Not on file    Active member of club or organization: Not on file    Attends meetings of clubs or organizations: Not on file    Relationship status: Not on file  . Intimate partner violence:    Fear of current or ex partner: Not on file    Emotionally abused: Not on file    Physically abused: Not on file    Forced sexual activity: Not on file  Other Topics Concern  . Not on file  Social History Narrative  . Not on file    Outpatient Medications Prior to Visit  Medication Sig Dispense Refill  . albuterol (ACCUNEB) 0.63 MG/3ML nebulizer solution Take 1 ampule by nebulization every 6 (six) hours as needed for wheezing.    Marland Kitchen albuterol (PROVENTIL HFA;VENTOLIN HFA) 108 (90 Base) MCG/ACT inhaler Inhale 2 puffs into the lungs every 6 (six) hours as needed for wheezing or shortness of breath.    . cyclobenzaprine (FLEXERIL) 10 MG tablet Take 10 mg by mouth 3 (three) times daily.     Marland Kitchen FLUoxetine (PROZAC) 40 MG capsule Take 40 mg by mouth daily.     Marland Kitchen gabapentin (NEURONTIN) 300 MG capsule Take 300 mg by mouth 3 (three) times daily.     Marland Kitchen linagliptin (TRADJENTA) 5 MG TABS tablet Take by mouth.    Marland Kitchen lisinopril-hydrochlorothiazide (PRINZIDE,ZESTORETIC) 10-12.5 MG tablet Take 1 tablet by mouth daily.    . meloxicam (MOBIC) 15 MG tablet Take by mouth.    . METFORMIN & DIET MANAGE PROD PO metformin    . metFORMIN (GLUCOPHAGE) 1000 MG tablet Take 1,000 mg by mouth 2 (two) times daily with a meal.    .  pantoprazole (PROTONIX) 20 MG tablet Take by mouth.    . pioglitazone (ACTOS) 15 MG tablet Take 15 mg by mouth daily.    . pramipexole (MIRAPEX) 0.5 MG tablet Take by mouth.    Marland Kitchen rOPINIRole (REQUIP) 0.25 MG tablet Take 0.25 mg by mouth 3 (three) times daily.    Marland Kitchen spironolactone (ALDACTONE) 100 MG tablet Take by mouth.    . Tiotropium Bromide-Olodaterol (STIOLTO RESPIMAT) 2.5-2.5 MCG/ACT AERS Inhale into the lungs.    . traZODone (DESYREL) 50 MG tablet Take 50 mg by mouth at bedtime.    . tretinoin (RETIN-A) 0.05 % cream Apply topically.    . Benzoyl Peroxide 10 % LIQD Apply topically.    . clindamycin (CLEOCIN T) 1 % lotion Apply in am to affected areas     No facility-administered medications prior to visit.       ROS:  Review of Systems  Constitutional: Negative for fever.  Gastrointestinal: Positive for diarrhea and nausea. Negative for blood in stool, constipation and vomiting.  Genitourinary: Negative for dyspareunia, dysuria, flank pain, frequency, hematuria, urgency, vaginal bleeding, vaginal discharge and vaginal pain.  Musculoskeletal: Negative for back pain.  Skin: Negative for rash.   BREAST: No symptoms   OBJECTIVE:   Vitals:  Pulse (!) 106   Ht '5\' 6"'  (1.676 m)   Wt (!) 350 lb (158.8 kg)   LMP 07/18/2018 (Exact Date)   BMI 56.49 kg/m   Physical Exam Vitals signs reviewed.  Constitutional:      Appearance: She is well-developed.  Pulmonary:     Effort: Pulmonary effort is normal.  Genitourinary:    Pubic Area: No rash.      Labia:        Right: No rash, tenderness or lesion.        Left: No rash, tenderness or lesion.      Vagina: Normal. No vaginal discharge, erythema or tenderness.     Cervix: Normal.     Uterus: Normal. Not tender.      Adnexa: Right adnexa normal and left adnexa normal.       Right: No tenderness.         Left: No tenderness.         Comments: MULT EXT GENITAL WARTS, NO REAL CHANGE WITH TCA TX Musculoskeletal: Normal  range of  motion.  Neurological:     Mental Status: She is alert and oriented to person, place, and time.  Psychiatric:        Behavior: Behavior normal.        Thought Content: Thought content normal.     Assessment/Plan: Screening for STD (sexually transmitted disease) - Will call with results.  - Plan: Cervicovaginal ancillary only, HIV Antibody (routine testing w rflx), RPR, Hepatitis C antibody  Genital warts - Try Rx aldara. Then f/u with derm if sx persist. - Plan: imiquimod (ALDARA) 5 % cream    Meds ordered this encounter  Medications  . imiquimod (ALDARA) 5 % cream    Sig: Apply M, W, F night, wash in AM. Can do up to 16 wks prn sx.    Dispense:  24 each    Refill:  1    Order Specific Question:   Supervising Provider    Answer:   Gae Dry [[id number]]      Return if symptoms worsen or fail to improve.   B. , PA-C 08/14/2018 2:55 PM

## 2018-08-15 LAB — CERVICOVAGINAL ANCILLARY ONLY
Chlamydia: NEGATIVE
Neisseria Gonorrhea: NEGATIVE
Trichomonas: NEGATIVE

## 2018-08-15 LAB — RPR: RPR Ser Ql: NONREACTIVE

## 2018-08-15 LAB — HEPATITIS C ANTIBODY: Hep C Virus Ab: <0.1 s/co ratio (ref 0.0–0.9)

## 2018-08-15 LAB — HIV ANTIBODY (ROUTINE TESTING W REFLEX): HIV SCREEN 4TH GENERATION: NONREACTIVE

## 2018-08-16 NOTE — Progress Notes (Signed)
Pt aware.

## 2018-08-16 NOTE — Progress Notes (Signed)
Pls let pt know all STD testing neg. Thx.

## 2018-09-22 ENCOUNTER — Other Ambulatory Visit: Payer: Self-pay

## 2018-09-22 ENCOUNTER — Emergency Department
Admission: EM | Admit: 2018-09-22 | Discharge: 2018-09-22 | Disposition: A | Discharge status: Home / Self Care | Payer: Medicaid Other | Attending: Emergency Medicine | Admitting: Emergency Medicine

## 2018-09-22 ENCOUNTER — Encounter: Payer: Self-pay | Admitting: Emergency Medicine

## 2018-09-22 DIAGNOSIS — J449 Chronic obstructive pulmonary disease, unspecified: Secondary | ICD-10-CM | POA: Diagnosis not present

## 2018-09-22 DIAGNOSIS — E119 Type 2 diabetes mellitus without complications: Secondary | ICD-10-CM | POA: Diagnosis not present

## 2018-09-22 DIAGNOSIS — Z79899 Other long term (current) drug therapy: Secondary | ICD-10-CM | POA: Diagnosis not present

## 2018-09-22 DIAGNOSIS — M2550 Pain in unspecified joint: Secondary | ICD-10-CM | POA: Insufficient documentation

## 2018-09-22 DIAGNOSIS — R601 Generalized edema: Secondary | ICD-10-CM | POA: Diagnosis present

## 2018-09-22 DIAGNOSIS — R609 Edema, unspecified: Secondary | ICD-10-CM | POA: Insufficient documentation

## 2018-09-22 DIAGNOSIS — Z7984 Long term (current) use of oral hypoglycemic drugs: Secondary | ICD-10-CM | POA: Diagnosis not present

## 2018-09-22 LAB — COMPREHENSIVE METABOLIC PANEL
ALT: 31 U/L (ref 0–44)
AST: 26 U/L (ref 15–41)
Albumin: 3.1 g/dL — ABNORMAL LOW (ref 3.5–5.0)
Alkaline Phosphatase: 63 U/L (ref 38–126)
Anion gap: 7 (ref 5–15)
BUN: 34 mg/dL — AB (ref 6–20)
CO2: 24 mmol/L (ref 22–32)
Calcium: 9.3 mg/dL (ref 8.9–10.3)
Chloride: 103 mmol/L (ref 98–111)
Creatinine, Ser: 1.28 mg/dL — ABNORMAL HIGH (ref 0.44–1.00)
GFR calc Af Amer: 60 mL/min — ABNORMAL LOW (ref >60)
GFR calc non Af Amer: 52 mL/min — ABNORMAL LOW (ref >60)
GLUCOSE: 212 mg/dL — AB (ref 70–99)
POTASSIUM: 4.5 mmol/L (ref 3.5–5.1)
Sodium: 134 mmol/L — ABNORMAL LOW (ref 135–145)
Total Bilirubin: <0.1 mg/dL — ABNORMAL LOW (ref 0.3–1.2)
Total Protein: 7 g/dL (ref 6.5–8.1)

## 2018-09-22 LAB — CBC WITH DIFFERENTIAL/PLATELET
Abs Immature Granulocytes: 0.11 10*3/uL — ABNORMAL HIGH (ref 0.00–0.07)
Basophils Absolute: 0.1 10*3/uL (ref 0.0–0.1)
Basophils Relative: 1 %
Eosinophils Absolute: 0.3 10*3/uL (ref 0.0–0.5)
Eosinophils Relative: 3 %
HCT: 34.2 % — ABNORMAL LOW (ref 36.0–46.0)
Hemoglobin: 10.9 g/dL — ABNORMAL LOW (ref 12.0–15.0)
Immature Granulocytes: 1 %
Lymphocytes Relative: 25 %
Lymphs Abs: 2.1 10*3/uL (ref 0.7–4.0)
MCH: 26.5 pg (ref 26.0–34.0)
MCHC: 31.9 g/dL (ref 30.0–36.0)
MCV: 83.2 fL (ref 80.0–100.0)
Monocytes Absolute: 0.6 10*3/uL (ref 0.1–1.0)
Monocytes Relative: 7 %
Neutro Abs: 5.2 10*3/uL (ref 1.7–7.7)
Neutrophils Relative %: 63 %
Platelets: 331 10*3/uL (ref 150–400)
RBC: 4.11 MIL/uL (ref 3.87–5.11)
RDW: 17.2 % — AB (ref 11.5–15.5)
WBC: 8.2 10*3/uL (ref 4.0–10.5)
nRBC: 0 % (ref 0.0–0.2)

## 2018-09-22 LAB — BRAIN NATRIURETIC PEPTIDE: B Natriuretic Peptide: 19 pg/mL (ref 0.0–100.0)

## 2018-09-22 LAB — INFLUENZA PANEL BY PCR (TYPE A & B)
INFLAPCR: NEGATIVE
Influenza B By PCR: NEGATIVE

## 2018-09-22 MED ORDER — KETOROLAC TROMETHAMINE 30 MG/ML IJ SOLN
30.0000 mg | Freq: Once | INTRAMUSCULAR | Status: AC
  Administered 2018-09-22: 30 mg via INTRAVENOUS
  Filled 2018-09-22: qty 1

## 2018-09-22 MED ORDER — ACETAMINOPHEN 325 MG PO TABS
650.0000 mg | ORAL_TABLET | Freq: Once | ORAL | Status: AC
  Administered 2018-09-22: 650 mg via ORAL
  Filled 2018-09-22: qty 2

## 2018-09-22 NOTE — ED Provider Notes (Signed)
[facility] Emergency Department Provider Note   ____________________________________________   I have reviewed the triage vital signs and the nursing notes.   HISTORY  Chief Complaint Joint Pain   History limited by: Not Limited   HPI Amy Cisneros is a 43 y.o. female who presents to the emergency department today with primary complaint of joint pain. The patient states that she has been dealing with cold like symptoms for quite some time. The patient then noticed four days ago started having some joint pain. Started in her knees. The next day had some hives and took benadryl which helped with the rash but she continued to have joint pain. It is now in every joint of her body. She has also noticed some swelling in her extremities and feels like her lymph nodes are enlarge. States that she has been staying at home.   Records reviewed. Per medical record review patient has a history of COPD  Past Medical History:  Diagnosis Date  . BRCA negative 02/2017   MyRisk neg; IBIS=18% per my calculation; BRCA gene pos in family, pt is neg.  . 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) DNA test positive   . COPD (chronic obstructive pulmonary disease) ([place])   . Diabetes mellitus without complication ([place])   . Family history of BRCA gene mutation    mat side  . Family history of breast cancer   . Family history of ovarian cancer   . Genital warts   . LGSIL on Pap smear of cervix 2014  . Obesity   . Sleep apnea     Patient Active Problem List   Diagnosis Date Noted  . Genital warts 03/23/2018  . BRCA negative 04/25/2017  . Chronic lower extremity pain (Secondary Area of Pain) (Bilateral) (R>L) 02/09/2017  . Chronic knee pain [facility] Area of Pain) (Bilateral) (R>L) 02/09/2017  . Chronic hip pain (Fourth Area of Pain) (Bilateral) (R>L) 02/09/2017  . Chronic shoulder pain (Fifth Area of Pain) (Bilateral) (R>L) 02/09/2017  . Chronic neck pain (Bilateral)  (R>L) 02/09/2017  . NSAID long-term use 02/09/2017  . Osteoarthritis of knee 02/08/2017  . Chronic pain syndrome 02/08/2017  . Opiate use 02/08/2017    Class: History of  . 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 Lumbar facet hypertrophy (Bilateral) 02/08/2017  . Chronic lumbar radiculitis of L3 (Left) 02/08/2017  . Chronic low back pain (Primary Area of Pain) (Bilateral) (L>R) 02/08/2017  . Morbid obesity with BMI of 50.0-59.9, adult ([place]) 02/08/2017  . Family history of breast cancer 02/01/2017  . 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) DNA test positive 02/01/2017  . Family history of BRCA gene positive 02/01/2017  . Dyspepsia 03/26/2014  . IBS (irritable bowel syndrome) 03/26/2014    Past Surgical History:  Procedure Laterality Date  . GALLBLADDER SURGERY      Prior to Admission medications   Medication Sig Start Date End Date Taking? Authorizing Provider  albuterol (ACCUNEB) 0.63 MG/3ML nebulizer solution Take 1 ampule by nebulization every 6 (six) hours as needed for wheezing.    [provider]  albuterol (PROVENTIL HFA;VENTOLIN HFA) 108 (90 Base) MCG/ACT inhaler Inhale 2 puffs into the lungs every 6 (six) hours as needed for wheezing or shortness of breath.    [provider]  Benzoyl Peroxide 10 % LIQD Apply topically. 08/07/18   [provider]  clindamycin (CLEOCIN T) 1 % lotion Apply in am to affected areas 08/07/18   [provider]  cyclobenzaprine (FLEXERIL) 10 MG tablet Take 10 mg by mouth 3 (three) times daily.     [provider]  FLUoxetine (PROZAC) 40 MG capsule Take 40 mg by mouth daily.     [provider]  gabapentin (NEURONTIN) 300 MG capsule Take 300 mg by mouth 3 (three) times daily.     [provider]  imiquimod [place]) 5 % cream Apply M, W, F night, wash in AM. Can do up to 16 wks prn sx. 1/61/09   Copland, Alicia B, PA-C  linagliptin (TRADJENTA) 5 MG TABS tablet Take by  mouth.    [provider]  lisinopril-hydrochlorothiazide (PRINZIDE,ZESTORETIC) 10-12.5 MG tablet Take 1 tablet by mouth daily.    [provider]  meloxicam (MOBIC) 15 MG tablet Take by mouth.    [provider]  METFORMIN & DIET MANAGE PROD PO metformin    [provider]  metFORMIN (GLUCOPHAGE) 1000 MG tablet Take 1,000 mg by mouth 2 (two) times daily with a meal.    [provider]  pantoprazole (PROTONIX) 20 MG tablet Take by mouth.    [provider]  pioglitazone (ACTOS) 15 MG tablet Take 15 mg by mouth daily.    [provider]  pramipexole (MIRAPEX) 0.5 MG tablet Take by mouth.    [provider]  rOPINIRole (REQUIP) 0.25 MG tablet Take 0.25 mg by mouth 3 (three) times daily.    [provider]  spironolactone (ALDACTONE) 100 MG tablet Take by mouth. 08/07/18 08/07/19  [provider]  Tiotropium Bromide-Olodaterol (STIOLTO RESPIMAT) 2.5-2.5 MCG/ACT AERS Inhale into the lungs.    [provider]  traZODone (DESYREL) 50 MG tablet Take 50 mg by mouth at bedtime.    [provider]  tretinoin (RETIN-A) 0.05 % cream Apply topically. 08/07/18   [provider]    Allergies Codeine and Morphine  Family History  Problem Relation Age of Onset  . Breast cancer Mother 76  . Diabetes Mother   . Breast cancer Sister 39  . Breast cancer Maternal Aunt 58  . Ovarian cancer Maternal Aunt 12  . Breast cancer Other 26       BRCA pos (daughter of affected aunt with breast cancer)  . Diabetes Father   . Kidney disease Father     Social History Social History   Tobacco Use  . Smoking status: Current Every Day Smoker    Packs/day: 0.50  . Smokeless tobacco: Never Used  Substance Use Topics  . Alcohol use: Yes    Comment: occassional  . Drug use: No    Review of Systems Constitutional: No fever/chills Eyes: No visual changes. ENT: No sore throat. Cardiovascular: Denies  chest pain. Respiratory: Positive for cough Gastrointestinal: No abdominal pain.  No nausea, no vomiting.  No diarrhea.   Genitourinary: Negative for dysuria. Musculoskeletal: Positive for joint pain.  Skin: Positive for hives which have resolved. Neurological: Negative for headaches, focal weakness or numbness.  ____________________________________________   PHYSICAL EXAM:  VITAL SIGNS: ED Triage Vitals  Enc Vitals Group     BP 09/22/18 1544 (!) 148/81     Pulse Rate 09/22/18 1544 92     Resp 09/22/18 1544 16     Temp 09/22/18 1544 98.3 F (36.8 C)     Temp Source 09/22/18 1544 Oral     SpO2 09/22/18 1544 97 %     Weight 09/22/18 1546 (!) 365 lb (165.6 kg)     Height 09/22/18 1546 5' 6" (1.676  m)     Head Circumference --      Peak Flow --      Pain Score 09/22/18 1546 8   Constitutional: Alert and oriented.  Eyes: Conjunctivae are normal.  ENT      Head: Normocephalic and atraumatic.      Nose: No congestion/rhinnorhea.      Mouth/Throat: Mucous membranes are moist.      Neck: No stridor. Hematological/Lymphatic/Immunilogical: No cervical lymphadenopathy. Cardiovascular: Normal rate, regular rhythm.  No murmurs, rubs, or gallops. Respiratory: Normal respiratory effort without tachypnea nor retractions. Breath sounds are clear and equal bilaterally. No wheezes/rales/rhonchi. Gastrointestinal: Soft and non tender. No rebound. No guarding.  Genitourinary: Deferred Musculoskeletal: Normal range of motion in all extremities.  Neurologic:  Normal speech and language. No gross focal neurologic deficits are appreciated.  Skin:  Skin is warm, dry and intact. No rash noted. Psychiatric: Mood and affect are normal. Speech and behavior are normal. Patient exhibits appropriate insight and judgment.  ____________________________________________    LABS (pertinent positives/negatives)  BNP 19.0 CMP na 134, glu 212, cr 1.28, t bili <0.1 CBC wbc 8.2, hgb 10.9, plt  331  ____________________________________________   EKG  None  ____________________________________________    RADIOLOGY  None  ____________________________________________   PROCEDURES  Procedures  ____________________________________________   INITIAL IMPRESSION / ASSESSMENT AND PLAN / ED COURSE  Pertinent labs & imaging results that were available during my care of the patient were reviewed by me and considered in my medical decision making (see chart for details).   Patient presented to the emergency department today concern for joint pain and swelling.  She also complained of some hives over the result.  Patient was flu negative here.  Did check blood work patient without any concerning LFT elevations, significant kidney dysfunction or elevated BNP.  At this point unclear etiology the patient's symptoms.  Do wonder if patient is suffering from viral illness.  Discussed with patient importance of following up with primary care physician.   ____________________________________________   FINAL CLINICAL IMPRESSION(S) / ED DIAGNOSES  Final diagnoses:  Edema, unspecified type  Arthralgia, unspecified joint     Note: This dictation was prepared with Dragon dictation. Any transcriptional errors that result from this process are unintentional     Nance Pear, MD 09/22/18 [phone]

## 2018-09-22 NOTE — ED Triage Notes (Signed)
Pt to ED via POV c/o joint pain that started 4 days ago. Pt states that 3 days ago she woke up with hives but she took benadryl and the hives went away. Pt states that she is still having joint pain. Pt has a congested cough. Pt states that her lymph nodes are swollen as well and she feels weak and achy. Pt is in NAD at this time.

## 2018-09-22 NOTE — ED Notes (Signed)
Pt verbalized understanding of d/c instructions, and f/u care. No further questions at this time. Pt assisted to exit via wheelchair.   

## 2018-09-22 NOTE — ED Notes (Signed)
URINE PREGNANCY TEST- NEGATIVE 

## 2018-09-22 NOTE — Discharge Instructions (Addendum)
Please seek medical attention for any high fevers, chest pain, shortness of breath, change in behavior, persistent vomiting, bloody stool or any other new or concerning symptoms.  

## 2019-01-17 ENCOUNTER — Other Ambulatory Visit: Payer: Self-pay

## 2019-01-17 ENCOUNTER — Ambulatory Visit (INDEPENDENT_AMBULATORY_CARE_PROVIDER_SITE_OTHER): Payer: Medicaid Other | Admitting: Obstetrics and Gynecology

## 2019-01-17 ENCOUNTER — Encounter: Payer: Self-pay | Admitting: Obstetrics and Gynecology

## 2019-01-17 VITALS — BP 147/82 | HR 101 | Ht 66.0 in | Wt 397.0 lb

## 2019-01-17 DIAGNOSIS — Z3200 Encounter for pregnancy test, result unknown: Secondary | ICD-10-CM

## 2019-01-17 DIAGNOSIS — R1110 Vomiting, unspecified: Secondary | ICD-10-CM

## 2019-01-17 DIAGNOSIS — Z3202 Encounter for pregnancy test, result negative: Secondary | ICD-10-CM

## 2019-01-17 LAB — POCT URINE PREGNANCY: Preg Test, Ur: NEGATIVE

## 2019-01-17 NOTE — Progress Notes (Signed)
Obstetrics & Gynecology Office Visit    Chief Complaint  Patient presents with  . Possible Pregnancy    N&V, frequent urination, breast tenderness    History of Present Illness: 43 y.o. [id number] female who presents with nausea, vomiting, frequent urination, fatigue, galactorrhea.  On July 11 her friend listened to the "baby's" heart beat using "Tiny Beat" app on her iphone and messaged her the sound.  She states that she can feel movement.  Her last menstrual period was on 12/24/2018.  She states that they have been lighter.  The last time she had sex for in March. She is otherwise not sexually active.  She states that she recently had blood work done at her PCP and the value of the result she was told was "-1."  She is concerned about the "hook effect," which is a theoretical effect found when the hCG level is too high to be detected by the usual methods.    Past Medical History:  Diagnosis Date  . BRCA negative 02/2017   MyRisk neg; IBIS=18% per my calculation; BRCA gene pos in family, pt is neg.  . 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) DNA test positive   . COPD (chronic obstructive pulmonary disease) ([place])   . Diabetes mellitus without complication ([place])   . Family history of BRCA gene mutation    mat side  . Family history of breast cancer   . Family history of ovarian cancer   . Genital warts   . LGSIL on Pap smear of cervix 2014  . Obesity   . Sleep apnea     Past Surgical History:  Procedure Laterality Date  . GALLBLADDER SURGERY      Gynecologic History: Patient's last menstrual period was 12/24/2018 (exact date).  Obstetric History: [id number]  Family History  Problem Relation Age of Onset  . Breast cancer Mother 35  . Diabetes Mother   . Breast cancer Sister 77  . Breast cancer Maternal Aunt 39  . Ovarian cancer Maternal Aunt 78  . Breast cancer Other 26       BRCA pos (daughter of affected aunt with breast cancer)  . Diabetes Father   . Kidney disease  Father     Social History   Socioeconomic History  . Marital status: Single    Spouse name: Not on file  . Number of children: Not on file  . Years of education: Not on file  . Highest education level: Not on file  Occupational History  . Not on file  Social Needs  . Financial resource strain: Not on file  . Food insecurity    Worry: Not on file    Inability: Not on file  . Transportation needs    Medical: Not on file    Non-medical: Not on file  Tobacco Use  . Smoking status: Current Every Day Smoker    Packs/day: 0.50  . Smokeless tobacco: Never Used  Substance and Sexual Activity  . Alcohol use: Yes    Comment: occassional  . Drug use: No  . Sexual activity: Yes    Birth control/protection: Condom  Lifestyle  . Physical activity    Days per week: Not on file    Minutes per session: Not on file  . Stress: Not on file  Relationships  . Social Herbalist on phone: Not on file    Gets together: Not on file    Attends religious service: Not on file    Active member  of club or organization: Not on file    Attends meetings of clubs or organizations: Not on file    Relationship status: Not on file  . Intimate partner violence    Fear of current or ex partner: Not on file    Emotionally abused: Not on file    Physically abused: Not on file    Forced sexual activity: Not on file  Other Topics Concern  . Not on file  Social History Narrative  . Not on file    Allergies  Allergen Reactions  . Codeine Hives  . Morphine Hives    Prior to Admission medications   Medication Sig Start Date End Date Taking? Authorizing Provider  albuterol (ACCUNEB) 0.63 MG/3ML nebulizer solution Take 1 ampule by nebulization every 6 (six) hours as needed for wheezing.   Yes [provider]  albuterol (PROVENTIL HFA;VENTOLIN HFA) 108 (90 Base) MCG/ACT inhaler Inhale 2 puffs into the lungs every 6 (six) hours as needed for wheezing or shortness of breath.   Yes  [provider]  Benzoyl Peroxide 10 % LIQD Apply topically. 08/07/18  Yes [provider]  clindamycin (CLEOCIN T) 1 % lotion Apply in am to affected areas 08/07/18  Yes [provider]  cyclobenzaprine (FLEXERIL) 10 MG tablet Take 10 mg by mouth 3 (three) times daily.    Yes [provider]  FLUoxetine (PROZAC) 40 MG capsule Take 40 mg by mouth daily.    Yes [provider]  gabapentin (NEURONTIN) 300 MG capsule Take 300 mg by mouth 3 (three) times daily.    Yes [provider]  imiquimod [place]) 5 % cream Apply M, W, F night, wash in AM. Can do up to 16 wks prn sx. 9/76/73  Yes Copland, Alicia B, PA-C  linagliptin (TRADJENTA) 5 MG TABS tablet Take by mouth.   Yes [provider]  lisinopril-hydrochlorothiazide (PRINZIDE,ZESTORETIC) 10-12.5 MG tablet Take 1 tablet by mouth daily.   Yes [provider]  meloxicam (MOBIC) 15 MG tablet Take by mouth.   Yes [provider]  METFORMIN & DIET MANAGE PROD PO metformin   Yes [provider]  metFORMIN (GLUCOPHAGE) 1000 MG tablet Take 1,000 mg by mouth 2 (two) times daily with a meal.   Yes [provider]  pantoprazole (PROTONIX) 20 MG tablet Take by mouth.   Yes [provider]  pioglitazone (ACTOS) 15 MG tablet Take 15 mg by mouth daily.   Yes [provider]  pramipexole (MIRAPEX) 0.5 MG tablet Take by mouth.   Yes [provider]  rOPINIRole (REQUIP) 0.25 MG tablet Take 0.25 mg by mouth 3 (three) times daily.   Yes [provider]  spironolactone (ALDACTONE) 100 MG tablet Take by mouth. 08/07/18 08/07/19 Yes [provider]  Tiotropium Bromide-Olodaterol (STIOLTO RESPIMAT) 2.5-2.5 MCG/ACT AERS Inhale into the lungs.   Yes [provider]  traZODone (DESYREL) 50 MG tablet Take 50 mg by mouth at bedtime.   Yes [provider]  tretinoin (RETIN-A) 0.05 % cream Apply topically. 08/07/18  Yes [provider]    Review of Systems  Constitutional: Negative.   HENT: Negative.   Eyes: Negative.   Respiratory: Negative.   Cardiovascular: Negative.   Gastrointestinal: Positive for nausea. Negative for abdominal pain, heartburn and vomiting.  Genitourinary: Positive for frequency. Negative for dysuria, flank pain, hematuria and urgency.  Musculoskeletal: Negative.   Skin: Negative.   Neurological: Negative.   Psychiatric/Behavioral: Negative.   Otherwise, she HPI  Physical Exam BP (!) 147/82 (BP Location: Left Arm, Patient Position: Sitting, Cuff Size: Large)   Pulse (!) 101   Ht 5' 6" (1.676 m)   Wt (!) 397 lb (180.1 kg)   LMP 12/24/2018 (Exact Date)   BMI 64.08 kg/m  Patient's last menstrual period was 12/24/2018 (exact date). Physical Exam Constitutional:      General: She is not in acute distress.    Appearance: Normal appearance. She is obese.  HENT:     Head: Normocephalic and atraumatic.  Abdominal:     General: There is no distension.     Palpations: Abdomen is soft.     Tenderness: There is no abdominal tenderness.     Comments: No fetal heart tones appreciated with doppler  Musculoskeletal: Normal range of motion.        General: No swelling.  Neurological:     General: No focal deficit present.     Mental Status: She is alert and oriented to person, place, and time.     Cranial Nerves: No cranial nerve deficit.  Skin:    General: Skin is warm and dry.     Coloration: Skin is not jaundiced.  Psychiatric:        Mood and Affect: Mood normal.        Behavior: Behavior normal.        Judgment: Judgment normal.  Vitals signs reviewed.     Female chaperone present for pelvic and breast  portions of the physical exam  Assessment: 43 y.o. [id number] female here for  1. Possible pregnancy      Plan: Problem List Items Addressed This Visit    None    Visit Diagnoses    Possible pregnancy    -  Primary   Relevant Orders   POCT urine pregnancy  (Completed)     We discussed that she is very likely not pregnant.  She insists that there is a possibility, even with regular periods since her last intercourse.  We discussed that no technology exists where a phone can use an app to hear a fetal heart tone without some sort of extra plug in device (I'm not sure whether this exists or not).  She played the sound for me of the "fetal heart beat." She stated that her friend used the corner of her iphone at about her mid abdomen and to the side to find the fetal heartbeat.  To satisfy this patient and to be sure we do not miss an extremely rare laboratory phenomenon, we will order a pelvic ultrasound.  Given the level of hormone that would have to be present, even in a molar pregnancy, and especially with a heartbeat present, something should be visible on ultrasound. Also, she feels movement, which would also have to show up on ultrasound. If this is negative, then we can safely say that she is not pregnant.    15 minutes spent in face to face discussion with > 50% spent in counseling,management, and coordination of care of her possible pregnancy.   Prentice Docker, MD 01/17/2019 11:21 AM

## 2019-01-19 ENCOUNTER — Other Ambulatory Visit: Payer: Medicaid Other

## 2019-01-22 ENCOUNTER — Ambulatory Visit: Payer: Medicaid Other

## 2019-01-26 ENCOUNTER — Ambulatory Visit (INDEPENDENT_AMBULATORY_CARE_PROVIDER_SITE_OTHER): Payer: Medicaid Other

## 2019-01-26 ENCOUNTER — Other Ambulatory Visit: Payer: Self-pay

## 2019-01-26 DIAGNOSIS — Z0389 Encounter for observation for other suspected diseases and conditions ruled out: Secondary | ICD-10-CM | POA: Diagnosis not present

## 2019-02-05 ENCOUNTER — Telehealth: Payer: Self-pay

## 2019-02-05 DIAGNOSIS — Z3200 Encounter for pregnancy test, result unknown: Secondary | ICD-10-CM

## 2019-02-05 NOTE — Telephone Encounter (Signed)
Pt called triage line stating She has had a negative blood/urine and ultrasound for pregnancy and she thinks she has an "abdominal pregnancy" Pt requesting other testing be done such as X-ray or MRI. CB# [phone]   I wasn't sure if pt needed to be scheduled for televisit so SDJ could discuss further with pt. Please advise

## 2019-02-07 NOTE — Telephone Encounter (Signed)
I think we've done all that can reasonably be done to reassure her.  I can't justify an X-ray or an MRI to further reassure her.  There is another issue with her and there is no evidence of pregnancy at all.  Of the 10's of thousands of pregnancies I've seen, none of them have been diagnosed with an MRI or XRay after a negative urine, blood, and pelvic ultrasound.  Thanks

## 2019-02-07 NOTE — Telephone Encounter (Signed)
Pt states no one has called her to go over the ultrasound results. She did get a Pharmacist, community message but she would really like to talk to SDJ so they can discuss her concerns since everything was negative. She says she feels her questions are not being answered. I answered all I could and even suggested she get a second opinion if she thought that would bring her satisfaction as far as her questions and concerns.   Pt aware SDJ is in office this afternoon but is seeing patients and may not get to calling her today.

## 2019-02-08 ENCOUNTER — Other Ambulatory Visit: Payer: Self-pay | Admitting: Obstetrics and Gynecology

## 2019-02-08 DIAGNOSIS — Z3200 Encounter for pregnancy test, result unknown: Secondary | ICD-10-CM

## 2019-02-08 NOTE — Telephone Encounter (Signed)
Discussed ultrasound results.  Patient is insistent on pregnancy.   Will get abdominal x-ray. I reassured her that as much as possible a pregnancy has been investigated. She states that she continues to have some abdominal symptoms. I offered an abdominal xray and strongly recommended she contact her PCP to work up any other causes to her symptoms.

## 2019-02-09 ENCOUNTER — Telehealth: Payer: Self-pay

## 2019-02-09 NOTE — Telephone Encounter (Signed)
Pt was called and told that they do not schedule for x-rays they are walk ins anytime before 5pm. Pt voiced understanding but wanted to get an appt so she was given the phone number to centralized scheduling. Pt was pleased with this.

## 2019-02-09 NOTE — Telephone Encounter (Signed)
-----   Message from [place] Lodge sent at 02/09/2019 11:47 AM EDT ----- Regarding: FW: Order abdominal xray Will you please schedule this? Thank you. ----- Message ----- From: Will Bonnet, MD Sent: 02/08/2019   4:36 PM EDT To: Alexandria Lodge Subject: Order abdominal xray                           Please see abdominal xray orders.  Thank you! Prentice Docker, MD

## 2019-02-14 ENCOUNTER — Other Ambulatory Visit: Payer: Self-pay

## 2019-02-14 ENCOUNTER — Ambulatory Visit
Admission: RE | Admit: 2019-02-14 | Discharge: 2019-02-14 | Disposition: A | Discharge status: Home / Self Care | Payer: Medicaid Other | Source: Ambulatory Visit | Attending: Obstetrics and Gynecology | Admitting: Obstetrics and Gynecology

## 2019-02-14 DIAGNOSIS — Z3200 Encounter for pregnancy test, result unknown: Secondary | ICD-10-CM | POA: Insufficient documentation

## 2019-05-22 ENCOUNTER — Ambulatory Visit: Payer: Medicaid Other | Admitting: Obstetrics and Gynecology

## 2019-05-22 NOTE — Progress Notes (Deleted)
PCP:  [name], MD   No chief complaint on file.    HPI:      [name] is a 43 y.o. [id number] who LMP was No LMP recorded., presents today for her annual examination.  Her menses are regular every 28-30 days, lasting 4 days.  Dysmenorrhea mild, occurring first 1-2 days of flow. She does not have intermenstrual bleeding.  Sex activity: single partner, contraception - condoms. Declines other BC. Condom broke recently. Normal menses since. Wants STD testing   Last Pap: February 01, 2017  Results were: no abnormalities /neg HPV DNA. Hx of mult abn paps. 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 Has been treated for ext warts with TCA 7/18 and 5/19. Pt states lesions resolve or get smaller. Has several new ones again and wants treated. Also has seen derm in past.   Last mammogram: yearly at [facility] per pt report. There is a strong FH of breast cancer on her mat side (mom, sister, mat aunt, niece). Affected niece is BRCA pos, pt is MyRisk neg 2018. IBIS=18%. There is a FH of ovarian cancer in her mat aunt. The patient does do self-breast exams.  Tobacco use: The patient currently smokes 1/2 packs of cigarettes per day for the past many years. Alcohol use: none No drug use.  Exercise: not active  She does get adequate calcium and Vitamin D in her diet.   Past Medical History:  Diagnosis Date  . BRCA negative 02/2017   MyRisk neg; IBIS=18% per my calculation; BRCA gene pos in family, pt is neg.  . 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) DNA test positive   . COPD (chronic obstructive pulmonary disease) ([place])   . Diabetes mellitus without complication ([place])   . Family history of BRCA gene mutation    mat side  . Family history of breast cancer   . Family history of ovarian cancer   . Genital warts   . LGSIL on Pap smear of cervix 2014  . Obesity   . Sleep apnea     Past Surgical History:  Procedure Laterality Date  . GALLBLADDER SURGERY      Family History  Problem  Relation Age of Onset  . Breast cancer Mother 33  . Diabetes Mother   . Breast cancer Sister 46  . Breast cancer Maternal Aunt 38  . Ovarian cancer Maternal Aunt 50  . Breast cancer Other 26       BRCA pos (daughter of affected aunt with breast cancer)  . Diabetes Father   . Kidney disease Father     Social History   Socioeconomic History  . Marital status: Single    Spouse name: Not on file  . Number of children: Not on file  . Years of education: Not on file  . Highest education level: Not on file  Occupational History  . Not on file  Social Needs  . Financial resource strain: Not on file  . Food insecurity    Worry: Not on file    Inability: Not on file  . Transportation needs    Medical: Not on file    Non-medical: Not on file  Tobacco Use  . Smoking status: Current Every Day Smoker    Packs/day: 0.50  . Smokeless tobacco: Never Used  Substance and Sexual Activity  . Alcohol use: Yes    Comment: occassional  . Drug use: No  . Sexual activity: Yes    Birth control/protection: Condom  Lifestyle  . Physical activity    Days per week: Not on file    Minutes per session: Not on file  . Stress: Not on file  Relationships  . Social Herbalist on phone: Not on file    Gets together: Not on file    Attends religious service: Not on file    Active member of club or organization: Not on file    Attends meetings of clubs or organizations: Not on file    Relationship status: Not on file  . Intimate partner violence    Fear of current or ex partner: Not on file    Emotionally abused: Not on file    Physically abused: Not on file    Forced sexual activity: Not on file  Other Topics Concern  . Not on file  Social History Narrative  . Not on file    Outpatient Medications Prior to Visit  Medication Sig Dispense Refill  . albuterol (ACCUNEB) 0.63 MG/3ML nebulizer solution Take 1 ampule by nebulization every 6 (six) hours as needed for wheezing.    Marland Kitchen  albuterol (PROVENTIL HFA;VENTOLIN HFA) 108 (90 Base) MCG/ACT inhaler Inhale 2 puffs into the lungs every 6 (six) hours as needed for wheezing or shortness of breath.    . Benzoyl Peroxide 10 % LIQD Apply topically.    . clindamycin (CLEOCIN T) 1 % lotion Apply in am to affected areas    . cyclobenzaprine (FLEXERIL) 10 MG tablet Take 10 mg by mouth 3 (three) times daily.     Marland Kitchen FLUoxetine (PROZAC) 40 MG capsule Take 40 mg by mouth daily.     Marland Kitchen gabapentin (NEURONTIN) 300 MG capsule Take 300 mg by mouth 3 (three) times daily.     . imiquimod (ALDARA) 5 % cream Apply M, W, F night, wash in AM. Can do up to 16 wks prn sx. 24 each 1  . linagliptin (TRADJENTA) 5 MG TABS tablet Take by mouth.    Marland Kitchen lisinopril-hydrochlorothiazide (PRINZIDE,ZESTORETIC) 10-12.5 MG tablet Take 1 tablet by mouth daily.    . meloxicam (MOBIC) 15 MG tablet Take by mouth.    . METFORMIN & DIET MANAGE PROD PO metformin    . metFORMIN (GLUCOPHAGE) 1000 MG tablet Take 1,000 mg by mouth 2 (two) times daily with a meal.    . pantoprazole (PROTONIX) 20 MG tablet Take by mouth.    . pioglitazone (ACTOS) 15 MG tablet Take 15 mg by mouth daily.    . pramipexole (MIRAPEX) 0.5 MG tablet Take by mouth.    Marland Kitchen rOPINIRole (REQUIP) 0.25 MG tablet Take 0.25 mg by mouth 3 (three) times daily.    Marland Kitchen spironolactone (ALDACTONE) 100 MG tablet Take by mouth.    . Tiotropium Bromide-Olodaterol (STIOLTO RESPIMAT) 2.5-2.5 MCG/ACT AERS Inhale into the lungs.    . traZODone (DESYREL) 50 MG tablet Take 50 mg by mouth at bedtime.    . tretinoin (RETIN-A) 0.05 % cream Apply topically.     No facility-administered medications prior to visit.      ROS:  Review of Systems  Constitutional: Negative for fatigue, fever and unexpected weight change.  Respiratory: Positive for shortness of breath and wheezing. Negative for cough.   Cardiovascular: Negative for chest pain, palpitations and leg swelling.  Gastrointestinal: Positive for constipation, diarrhea,  nausea and vomiting. Negative for blood in stool.  Endocrine: Negative for cold intolerance, heat intolerance and polyuria.  Genitourinary: Positive for dysuria, frequency, urgency and vaginal bleeding. Negative for dyspareunia,  flank pain, genital sores, hematuria, menstrual problem, pelvic pain, vaginal discharge and vaginal pain.  Musculoskeletal: Positive for arthralgias. Negative for back pain, joint swelling and myalgias.  Skin: Negative for rash.  Neurological: Negative for dizziness, syncope, light-headedness, numbness and headaches.  Hematological: Negative for adenopathy.  Psychiatric/Behavioral: Negative for agitation, confusion, sleep disturbance and suicidal ideas. The patient is not nervous/anxious.   BREAST: No symptoms   Objective: There were no vitals taken for this visit.   Physical Exam Constitutional:      Appearance: She is well-developed.  Genitourinary:     Uterus normal.     Vaginal bleeding present.     No vaginal discharge, erythema or tenderness.     No cervical motion tenderness or polyp.     Right adnexa not tender.     Left adnexa not tender.  Neck:     Musculoskeletal: Normal range of motion.     Thyroid: No thyromegaly.  Cardiovascular:     Rate and Rhythm: Normal rate and regular rhythm.     Heart sounds: Normal heart sounds. No murmur.  Pulmonary:     Effort: Pulmonary effort is normal.     Breath sounds: Normal breath sounds.  Chest:     Breasts:        Right: No mass, nipple discharge, skin change or tenderness.        Left: No mass, nipple discharge, skin change or tenderness.  Abdominal:     Palpations: Abdomen is soft.     Tenderness: There is no abdominal tenderness. There is no guarding.  Musculoskeletal: Normal range of motion.  Neurological:     Mental Status: She is alert and oriented to person, place, and time.     Cranial Nerves: No cranial nerve deficit.  Psychiatric:        Behavior: Behavior normal.  Vitals signs  reviewed.     Results: No results found for this or any previous visit (from the past 24 hour(s)).PT WITH MENSTRUAL BLEEDING    Assessment/Plan: No diagnosis found.      GYN counsel breast self exam, mammography screening, adequate intake of calcium and vitamin D, diet and exercise     F/U  No follow-ups on file.   B. , PA-C 05/22/2019 7:04 PM

## 2019-05-23 ENCOUNTER — Ambulatory Visit: Payer: Medicaid Other | Admitting: Obstetrics and Gynecology

## 2019-05-25 DIAGNOSIS — L732 Hidradenitis suppurativa: Secondary | ICD-10-CM | POA: Insufficient documentation

## 2019-05-30 DIAGNOSIS — F411 Generalized anxiety disorder: Secondary | ICD-10-CM | POA: Insufficient documentation

## 2019-05-30 DIAGNOSIS — F320 Major depressive disorder, single episode, mild: Secondary | ICD-10-CM | POA: Insufficient documentation

## 2019-08-10 DIAGNOSIS — F319 Bipolar disorder, unspecified: Secondary | ICD-10-CM | POA: Insufficient documentation

## 2019-09-10 ENCOUNTER — Other Ambulatory Visit: Payer: Self-pay

## 2019-09-10 ENCOUNTER — Encounter: Payer: Self-pay | Admitting: Emergency Medicine

## 2019-09-10 ENCOUNTER — Emergency Department
Admission: EM | Admit: 2019-09-10 | Discharge: 2019-09-10 | Disposition: A | Discharge status: Home / Self Care | Payer: Medicaid Other | Attending: Emergency Medicine | Admitting: Emergency Medicine

## 2019-09-10 DIAGNOSIS — F17210 Nicotine dependence, cigarettes, uncomplicated: Secondary | ICD-10-CM | POA: Insufficient documentation

## 2019-09-10 DIAGNOSIS — J449 Chronic obstructive pulmonary disease, unspecified: Secondary | ICD-10-CM | POA: Insufficient documentation

## 2019-09-10 DIAGNOSIS — L732 Hidradenitis suppurativa: Secondary | ICD-10-CM

## 2019-09-10 DIAGNOSIS — Z79899 Other long term (current) drug therapy: Secondary | ICD-10-CM | POA: Insufficient documentation

## 2019-09-10 DIAGNOSIS — Z7984 Long term (current) use of oral hypoglycemic drugs: Secondary | ICD-10-CM | POA: Diagnosis not present

## 2019-09-10 DIAGNOSIS — L0291 Cutaneous abscess, unspecified: Secondary | ICD-10-CM | POA: Insufficient documentation

## 2019-09-10 DIAGNOSIS — E119 Type 2 diabetes mellitus without complications: Secondary | ICD-10-CM | POA: Diagnosis not present

## 2019-09-10 NOTE — ED Triage Notes (Signed)
Pt to ED from home c/o rash, and abscess to mainly left hip area.  States hx of boils and skin infections. States some are draining.

## 2019-09-10 NOTE — ED Notes (Signed)
Patient states she has a medical condition that causes abscesses. Patient states she is currently taking ABT, but one abscess is really sore and hurts. Patient has multiple areas of redness on left hip, with one area having a dark blackened center.

## 2019-09-10 NOTE — ED Provider Notes (Signed)
[facility] EMERGENCY DEPARTMENT Provider Note   CSN: [id number] Arrival date & time: 09/10/19  2134     History Chief Complaint  Patient presents with  . Abscess    Amy Cisneros is a 44 y.o. female presents to the emergency department for evaluation of multiple abscesses.  She has a history of hidradenitis suppurativa, recently had large abscess that was cultured and shown to be sensitive to Bactrim.  Patient was placed on Bactrim and has had some improvement of all abscesses underneath the pannus, left buttocks, right leg, scalp.  She is concerned because a friend of hers was recently diagnosed with MRSA and is concerned she could have MRSA and wants to be placed on antibiotic to cover MRSA.  Patient states overall all of her abscesses have been decreasing in size and pain with starting Bactrim 1 week ago.  She denies any fevers, chills.  HPI     Past Medical History:  Diagnosis Date  . BRCA negative 02/2017   MyRisk neg; IBIS=18% per my calculation; BRCA gene pos in family, pt is neg.  . 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) DNA test positive   . COPD (chronic obstructive pulmonary disease) ([place])   . Diabetes mellitus without complication ([place])   . Family history of BRCA gene mutation    mat side  . Family history of breast cancer   . Family history of ovarian cancer   . Genital warts   . LGSIL on Pap smear of cervix 2014  . Obesity   . Sleep apnea     Patient Active Problem List   Diagnosis Date Noted  . Genital warts 03/23/2018  . BRCA negative 04/25/2017  . Chronic lower extremity pain (Secondary Area of Pain) (Bilateral) (R>L) 02/09/2017  . Chronic knee pain [facility] Area of Pain) (Bilateral) (R>L) 02/09/2017  . Chronic hip pain (Fourth Area of Pain) (Bilateral) (R>L) 02/09/2017  . Chronic shoulder pain (Fifth Area of Pain) (Bilateral) (R>L) 02/09/2017  . Chronic neck pain (Bilateral) (R>L) 02/09/2017  . NSAID long-term use  02/09/2017  . Osteoarthritis of knee 02/08/2017  . Chronic pain syndrome 02/08/2017  . Opiate use 02/08/2017    Class: History of  . 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 Lumbar facet hypertrophy (Bilateral) 02/08/2017  . Chronic lumbar radiculitis of L3 (Left) 02/08/2017  . Chronic low back pain (Primary Area of Pain) (Bilateral) (L>R) 02/08/2017  . Morbid obesity with BMI of 50.0-59.9, adult ([place]) 02/08/2017  . Family history of breast cancer 02/01/2017  . 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) DNA test positive 02/01/2017  . Family history of BRCA gene positive 02/01/2017  . Dyspepsia 03/26/2014  . IBS (irritable bowel syndrome) 03/26/2014    Past Surgical History:  Procedure Laterality Date  . GALLBLADDER SURGERY       OB History    Gravida  3   Para  2   Term  2   Preterm      AB  1   Living  2     SAB  1   TAB      Ectopic      Multiple      Live Births              Family History  Problem Relation Age of Onset  . Breast cancer Mother 87  . Diabetes Mother   . Breast cancer Sister 48  . Breast cancer Maternal Aunt 52  . Ovarian  cancer Maternal Aunt 50  . Breast cancer Other 26       BRCA pos (daughter of affected aunt with breast cancer)  . Diabetes Father   . Kidney disease Father     Social History   Tobacco Use  . Smoking status: Current Every Day Smoker    Packs/day: 0.50  . Smokeless tobacco: Never Used  Substance Use Topics  . Alcohol use: Yes    Comment: occassional  . Drug use: No    Home Medications Prior to Admission medications   Medication Sig Start Date End Date Taking? Authorizing Provider  albuterol (ACCUNEB) 0.63 MG/3ML nebulizer solution Take 1 ampule by nebulization every 6 (six) hours as needed for wheezing.    [provider]  albuterol (PROVENTIL HFA;VENTOLIN HFA) 108 (90 Base) MCG/ACT inhaler Inhale 2 puffs into the lungs every 6 (six) hours as needed for wheezing or  shortness of breath.    [provider]  Benzoyl Peroxide 10 % LIQD Apply topically. 08/07/18   [provider]  clindamycin (CLEOCIN T) 1 % lotion Apply in am to affected areas 08/07/18   [provider]  cyclobenzaprine (FLEXERIL) 10 MG tablet Take 10 mg by mouth 3 (three) times daily.     [provider]  FLUoxetine (PROZAC) 40 MG capsule Take 40 mg by mouth daily.     [provider]  gabapentin (NEURONTIN) 300 MG capsule Take 300 mg by mouth 3 (three) times daily.     [provider]  imiquimod (ALDARA) 5 % cream Apply M, W, F night, wash in AM. Can do up to 16 wks prn sx. 08/14/18   Copland, Alicia B, PA-C  linagliptin (TRADJENTA) 5 MG TABS tablet Take by mouth.    [provider]  lisinopril-hydrochlorothiazide (PRINZIDE,ZESTORETIC) 10-12.5 MG tablet Take 1 tablet by mouth daily.    [provider]  meloxicam (MOBIC) 15 MG tablet Take by mouth.    [provider]  METFORMIN & DIET MANAGE PROD PO metformin    [provider]  metFORMIN (GLUCOPHAGE) 1000 MG tablet Take 1,000 mg by mouth 2 (two) times daily with a meal.    [provider]  pantoprazole (PROTONIX) 20 MG tablet Take by mouth.    [provider]  pioglitazone (ACTOS) 15 MG tablet Take 15 mg by mouth daily.    [provider]  pramipexole (MIRAPEX) 0.5 MG tablet Take by mouth.    [provider]  rOPINIRole (REQUIP) 0.25 MG tablet Take 0.25 mg by mouth 3 (three) times daily.    [provider]  spironolactone (ALDACTONE) 100 MG tablet Take by mouth. 08/07/18 08/07/19  [provider]  Tiotropium Bromide-Olodaterol (STIOLTO RESPIMAT) 2.5-2.5 MCG/ACT AERS Inhale into the lungs.    [provider]  traZODone (DESYREL) 50 MG tablet Take 50 mg by mouth at bedtime.    [provider]  tretinoin (RETIN-A) 0.05 % cream Apply topically. 08/07/18   [provider]    Allergies      Codeine and Morphine  Review of Systems   Review of Systems  Constitutional: Negative for chills and fever.  Respiratory: Negative for shortness of breath.   Cardiovascular: Negative for chest pain.  Gastrointestinal: Negative for nausea and vomiting.  Skin: Positive for wound.    Physical Exam Updated Vital Signs BP (!) 168/73 (BP Location: Right Arm)   Pulse 88   Temp 98.2 F (36.8 C) (Oral)   Resp 18   Ht 5'   6" (1.676 m)   Wt (!) 167.8 kg   SpO2 100%   BMI 59.72 kg/m   Physical Exam Constitutional:      Appearance: She is well-developed.  HENT:     Head: Normocephalic and atraumatic.  Eyes:     Conjunctiva/sclera: Conjunctivae normal.  Cardiovascular:     Rate and Rhythm: Normal rate.  Pulmonary:     Effort: Pulmonary effort is normal. No respiratory distress.  Musculoskeletal:        General: Normal range of motion.     Cervical back: Normal range of motion.  Skin:    General: Skin is warm.     Findings: No rash.     Comments: Patient with multiple closed nonfluctuant slightly indurated abscesses along the pannus, left buttocks, left posterior lateral thigh.  No drainage.  Very minimal surrounding erythema along the lesions.  No swelling or edema in both lower legs.  Neurological:     Mental Status: She is alert and oriented to person, place, and time.  Psychiatric:        Behavior: Behavior normal.        Thought Content: Thought content normal.     ED Results / Procedures / Treatments   Labs (all labs ordered are listed, but only abnormal results are displayed) Labs Reviewed - No data to display  EKG None  Radiology No results found.  Procedures Procedures (including critical care time)  Medications Ordered in ED Medications - No data to display  ED Course  I have reviewed the triage vital signs and the nursing notes.  Pertinent labs & imaging results that were available during my care of the patient were reviewed by me and considered in  my medical decision making (see chart for details).    MDM Rules/Calculators/A&P                      44 year old female with hidradenitis suppurativa.  She is currently taking Bactrim and seen improvement of all abscesses both pain and size.  Vital signs are stable, afebrile.  She is concerned about not having antibiotic coverage for MRSA.  She is educated that Bactrim typically covers MRSA.  She understands signs and symptoms return to the ED for such as any fevers increasing abscesses, worsening symptoms or to change in her health. Final Clinical Impression(s) / ED Diagnoses Final diagnoses:  Abscess  Hidradenitis suppurativa    Rx / DC Orders ED Discharge Orders    None       Renata Caprice 09/10/19 2257    Blake Divine, MD 09/11/19 0000

## 2019-09-10 NOTE — Discharge Instructions (Addendum)
Please continue with Bactrim.  If any fevers, worsening abscesses, redness, drainage, return to the ER.

## 2019-12-13 DIAGNOSIS — F1120 Opioid dependence, uncomplicated: Secondary | ICD-10-CM | POA: Insufficient documentation

## 2020-11-05 IMAGING — CR ABDOMEN - 2 VIEW
1 series · 5 of 5 positions shown · non-contrast
Comparison: Images from outside pelvic ultrasound 01/26/2019 done
at [facility]

CLINICAL DATA: Fluttering abdominal sensations with nausea and
vomiting for 5 months. Weight gain.

EXAM:
ABDOMEN - 2 VIEW

[Series 1: dg abd 2 views · 0.14mm/px · 5 of 5 slices shown]
[im 1/5]
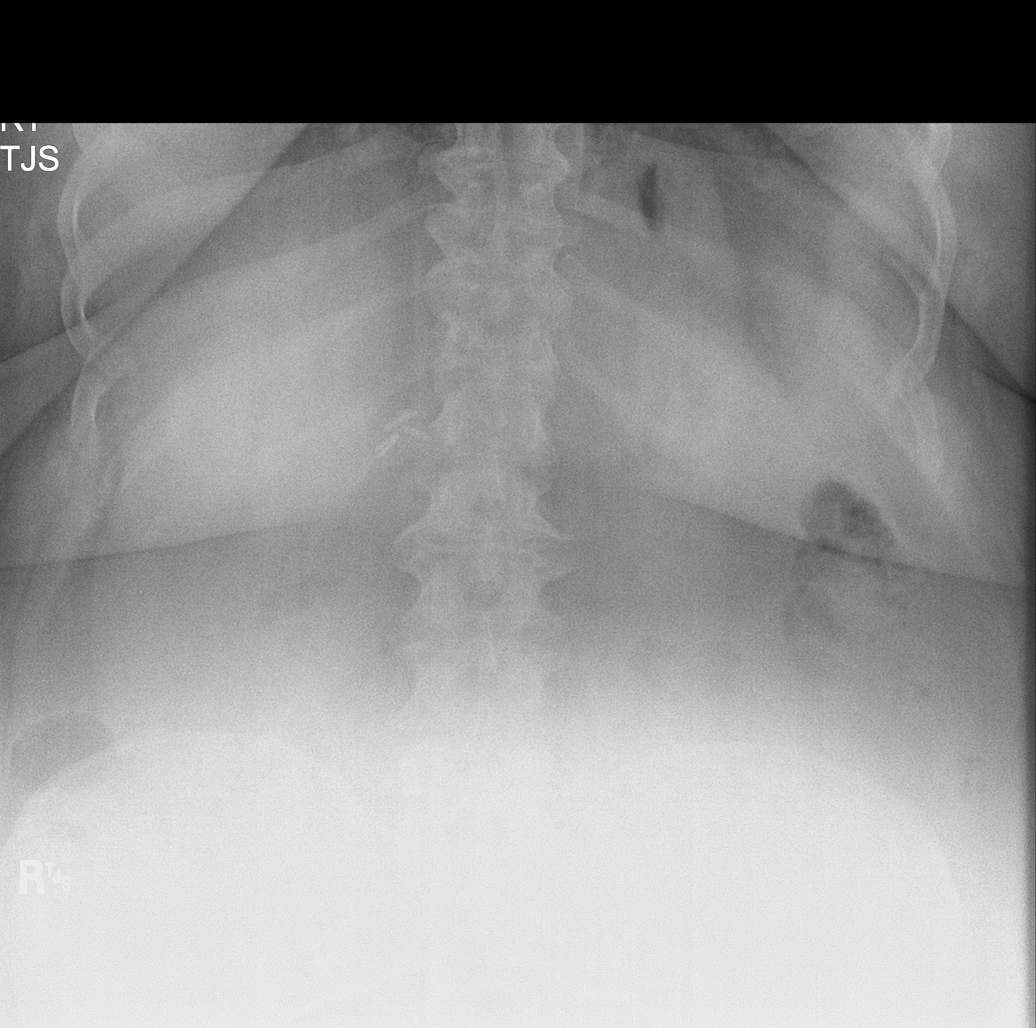
[im 2/5]
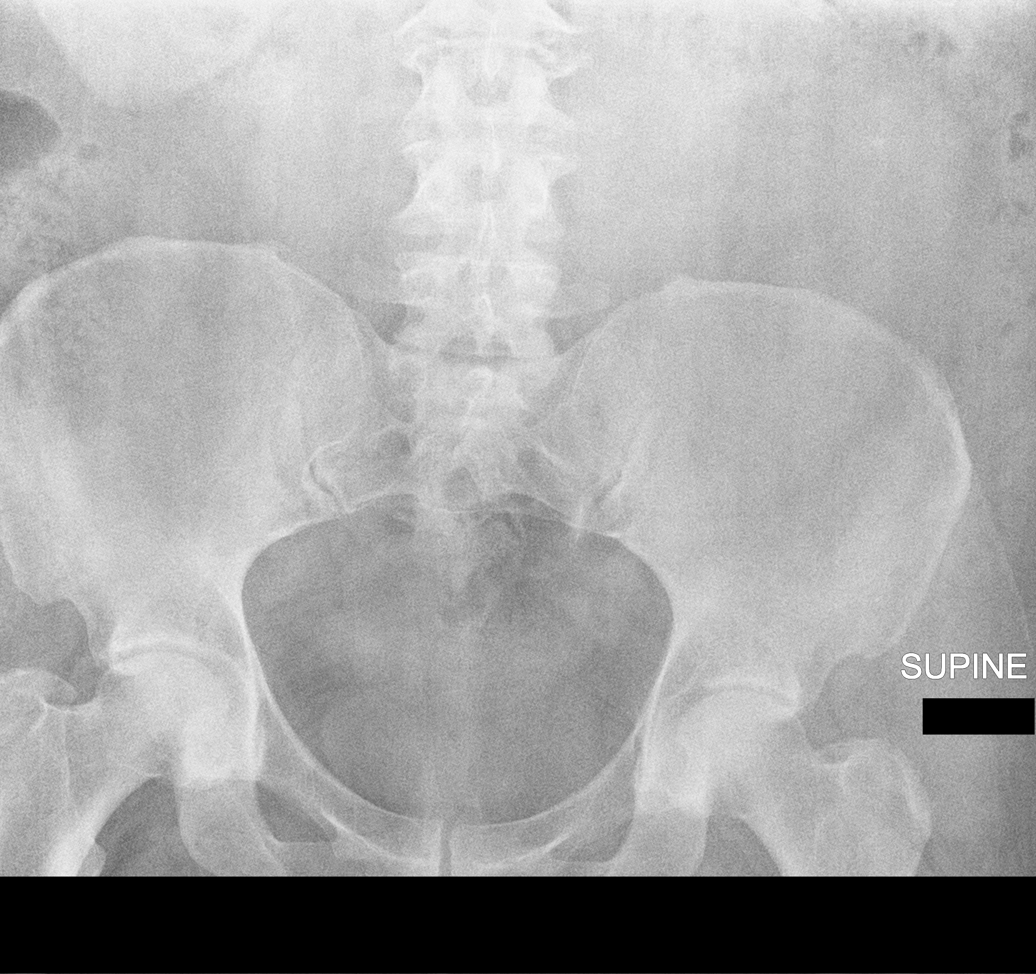
[im 3/5]
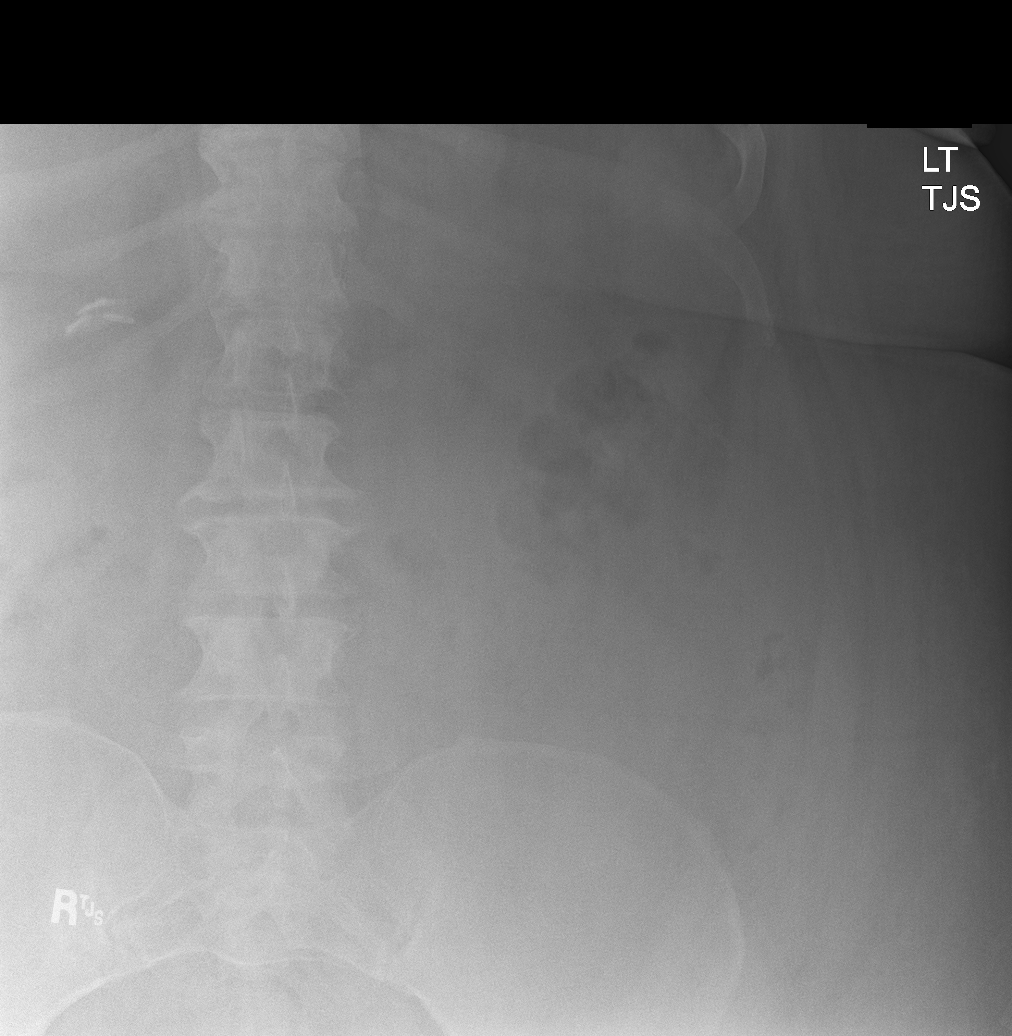
[im 4/5]
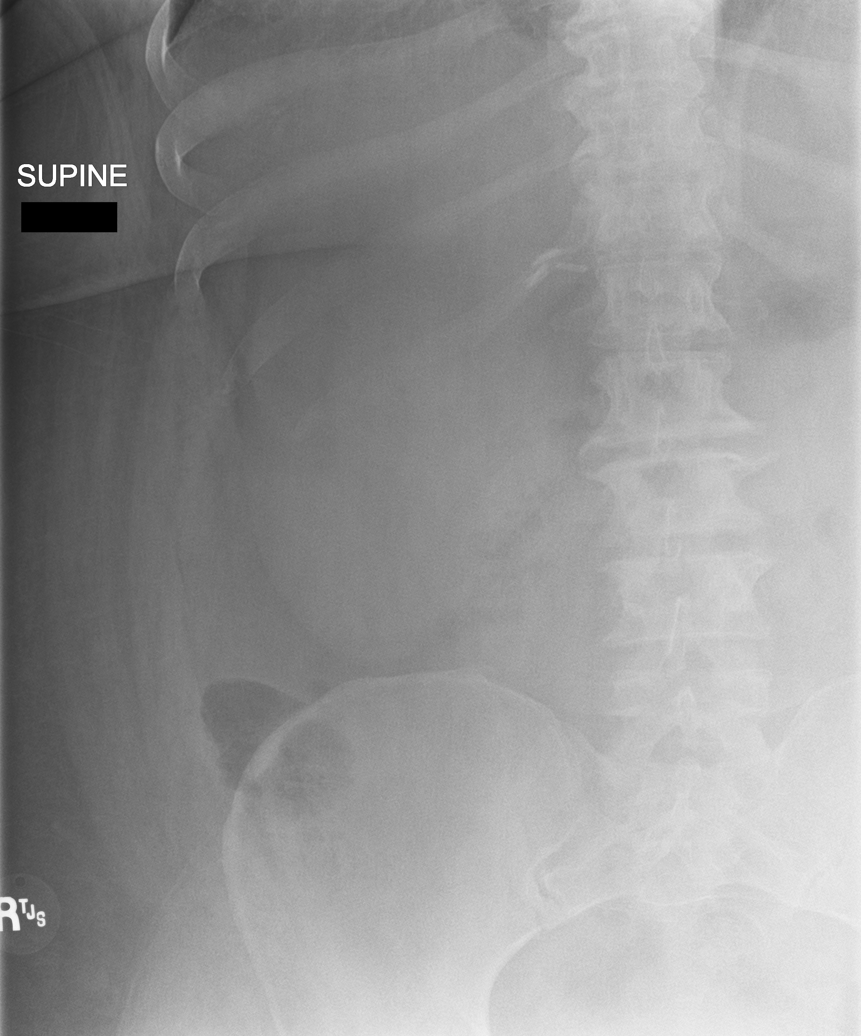
[im 5/5]
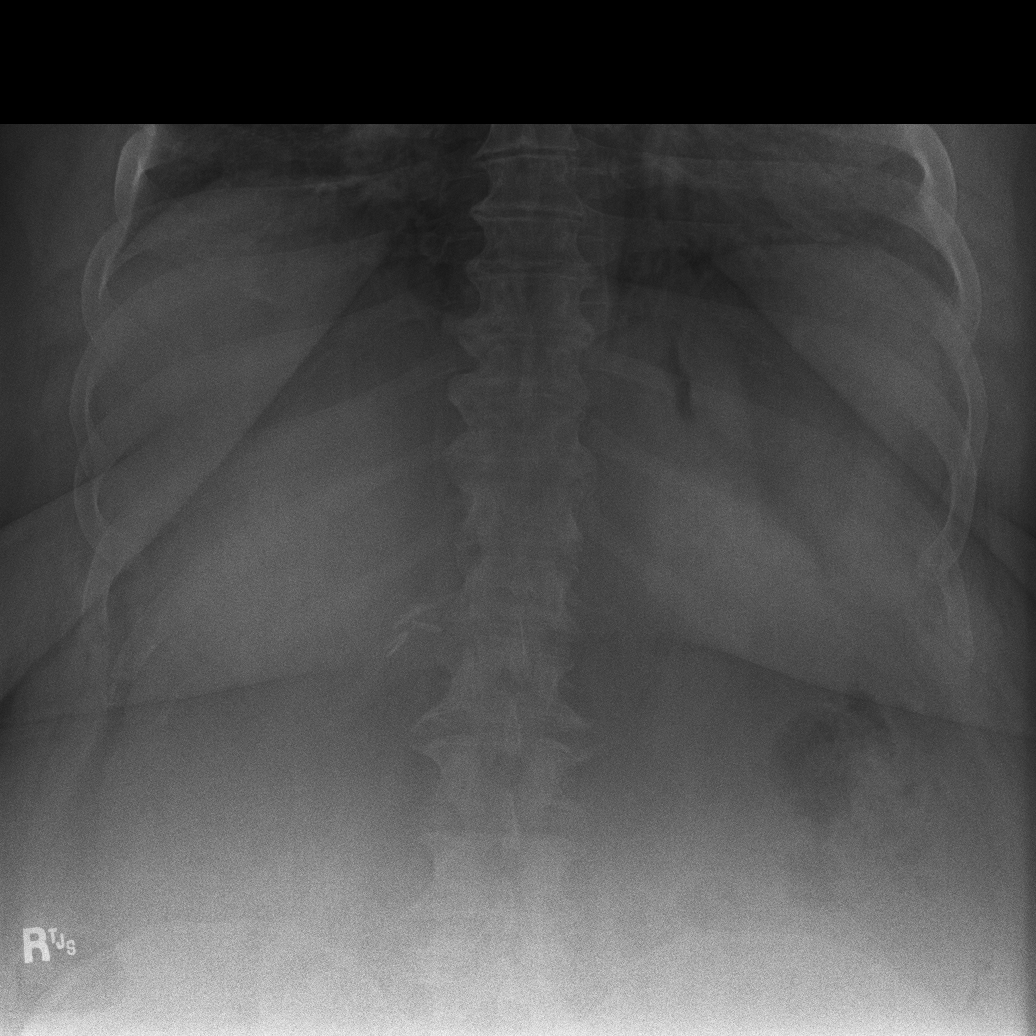

[5 of 5 positions shown; findings below may reference images not displayed]

FINDINGS: To clarify the indication for the study of "possible
pregnancy/symptoms of pregnancy", the patient was interviewed. The
ultrasound was performed for this indication. Urine pregnancy test
on 01/17/2019 was negative. The patient has not had intercourse
since Monday September, 2018. Therefore, we proceed with the ordered
examination as consented by the patient. Patient signed a pregnancy
test waiver.

Several views were obtained due to body habitus. The bowel gas
pattern is normal. There is no free intraperitoneal air. There are
cholecystectomy clips. No suspicious abdominal calcifications. No
evidence of intra-abdominal pregnancy. Thoracolumbar spondylosis
noted.
IMPRESSION: No evidence of active abdominal process.  Thoracolumbar spondylosis.

## 2021-06-09 ENCOUNTER — Ambulatory Visit: Payer: Medicaid Other | Admitting: Obstetrics and Gynecology

## 2021-06-09 NOTE — Progress Notes (Deleted)
PCP:  [name], MD   No chief complaint on file.    HPI:      [name] is a 45 y.o. [id number] who LMP was No LMP recorded., presents today for her annual examination.  Her menses are regular every 28-30 days, lasting 4 days.  Dysmenorrhea mild, occurring first 1-2 days of flow. She does not have intermenstrual bleeding.  Sex activity: single partner, contraception - condoms. Declines other BC. Condom broke recently. Normal menses since. Wants STD testing   Last Pap: 03/23/18 Results were: no abnormalities /neg HPV DNA. Hx of mult abn paps. 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 Has been treated for ext warts with TCA 7/18 and 5/19. Pt states lesions resolve or get smaller. Has several new ones again and wants treated. Also has seen derm in past.   Last mammogram: 09/08/18 at [facility]. There is a strong FH of breast cancer on her mat side (mom, sister, mat aunt, niece). Affected niece is BRCA pos, pt is MyRisk neg 2018. IBIS=18%. There is a FH of ovarian cancer in her mat aunt. The patient does do self-breast exams.  Tobacco use: The patient currently smokes 1/2 packs of cigarettes per day for the past many years. Alcohol use: none No drug use.  Exercise: not active  She does get adequate calcium and Vitamin D in her diet.   Past Medical History:  Diagnosis Date   BRCA negative 02/2017   MyRisk neg; IBIS=18% per my calculation; BRCA gene pos in family, pt is neg.   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) DNA test positive    COPD (chronic obstructive pulmonary disease) (H[facility])    Diabetes mellitus without complication ([facility])    Family history of BRCA gene mutation    mat side   Family history of breast cancer    Family history of ovarian cancer    Genital warts    LGSIL on Pap smear of cervix 2014   Obesity    Sleep apnea     Past Surgical History:  Procedure Laterality Date   GALLBLADDER SURGERY      Family History  Problem Relation Age of Onset   Breast  cancer Mother 32   Diabetes Mother    Breast cancer Sister 44   Breast cancer Maternal Aunt 62   Ovarian cancer Maternal Aunt 66   Breast cancer Other 29       BRCA pos (daughter of affected aunt with breast cancer)   Diabetes Father    Kidney disease Father     Social History   Socioeconomic History   Marital status: Single    Spouse name: Not on file   Number of children: Not on file   Years of education: Not on file   Highest education level: Not on file  Occupational History   Not on file  Tobacco Use   Smoking status: Every Day    Packs/day: 0.50    Types: Cigarettes   Smokeless tobacco: Never  Vaping Use   Vaping Use: Never used  Substance and Sexual Activity   Alcohol use: Yes    Comment: occassional   Drug use: No   Sexual activity: Yes    Birth control/protection: Condom  Other Topics Concern   Not on file  Social History Narrative   Not on file   Social Determinants of Health   Financial Resource Strain: Not on file  Food Insecurity: Not on file  Transportation Needs: Not on  file  Physical Activity: Not on file  Stress: Not on file  Social Connections: Not on file  Intimate Partner Violence: Not on file    Outpatient Medications Prior to Visit  Medication Sig Dispense Refill   albuterol (ACCUNEB) 0.63 MG/3ML nebulizer solution Take 1 ampule by nebulization every 6 (six) hours as needed for wheezing.     albuterol (PROVENTIL HFA;VENTOLIN HFA) 108 (90 Base) MCG/ACT inhaler Inhale 2 puffs into the lungs every 6 (six) hours as needed for wheezing or shortness of breath.     Benzoyl Peroxide 10 % LIQD Apply topically.     clindamycin (CLEOCIN T) 1 % lotion Apply in am to affected areas     cyclobenzaprine (FLEXERIL) 10 MG tablet Take 10 mg by mouth 3 (three) times daily.      FLUoxetine (PROZAC) 40 MG capsule Take 40 mg by mouth daily.      gabapentin (NEURONTIN) 300 MG capsule Take 300 mg by mouth 3 (three) times daily.      imiquimod (ALDARA) 5 %  cream Apply M, W, F night, wash in AM. Can do up to 16 wks prn sx. 24 each 1   linagliptin (TRADJENTA) 5 MG TABS tablet Take by mouth.     lisinopril-hydrochlorothiazide (PRINZIDE,ZESTORETIC) 10-12.5 MG tablet Take 1 tablet by mouth daily.     meloxicam (MOBIC) 15 MG tablet Take by mouth.     METFORMIN & DIET MANAGE PROD PO metformin     metFORMIN (GLUCOPHAGE) 1000 MG tablet Take 1,000 mg by mouth 2 (two) times daily with a meal.     pantoprazole (PROTONIX) 20 MG tablet Take by mouth.     pioglitazone (ACTOS) 15 MG tablet Take 15 mg by mouth daily.     pramipexole (MIRAPEX) 0.5 MG tablet Take by mouth.     rOPINIRole (REQUIP) 0.25 MG tablet Take 0.25 mg by mouth 3 (three) times daily.     spironolactone (ALDACTONE) 100 MG tablet Take by mouth.     Tiotropium Bromide-Olodaterol (STIOLTO RESPIMAT) 2.5-2.5 MCG/ACT AERS Inhale into the lungs.     traZODone (DESYREL) 50 MG tablet Take 50 mg by mouth at bedtime.     tretinoin (RETIN-A) 0.05 % cream Apply topically.     No facility-administered medications prior to visit.     ROS:  Review of Systems  Constitutional:  Negative for fatigue, fever and unexpected weight change.  Respiratory:  Positive for shortness of breath and wheezing. Negative for cough.   Cardiovascular:  Negative for chest pain, palpitations and leg swelling.  Gastrointestinal:  Positive for constipation, diarrhea, nausea and vomiting. Negative for blood in stool.  Endocrine: Negative for cold intolerance, heat intolerance and polyuria.  Genitourinary:  Positive for dysuria, frequency, urgency and vaginal bleeding. Negative for dyspareunia, flank pain, genital sores, hematuria, menstrual problem, pelvic pain, vaginal discharge and vaginal pain.  Musculoskeletal:  Positive for arthralgias. Negative for back pain, joint swelling and myalgias.  Skin:  Negative for rash.  Neurological:  Negative for dizziness, syncope, light-headedness, numbness and headaches.  Hematological:   Negative for adenopathy.  Psychiatric/Behavioral:  Negative for agitation, confusion, sleep disturbance and suicidal ideas. The patient is not nervous/anxious.  BREAST: No symptoms   Objective: There were no vitals taken for this visit.   Physical Exam Constitutional:      Appearance: She is well-developed.  Genitourinary:     Vaginal bleeding present.     No vaginal discharge, erythema or tenderness.      Right Adnexa: not  tender.    Left Adnexa: not tender.    No cervical motion tenderness or polyp.  Breasts:    Right: No mass, nipple discharge, skin change or tenderness.     Left: No mass, nipple discharge, skin change or tenderness.  Neck:     Thyroid: No thyromegaly.  Cardiovascular:     Rate and Rhythm: Normal rate and regular rhythm.     Heart sounds: Normal heart sounds. No murmur heard. Pulmonary:     Effort: Pulmonary effort is normal.     Breath sounds: Normal breath sounds.  Abdominal:     Palpations: Abdomen is soft.     Tenderness: There is no abdominal tenderness. There is no guarding.  Musculoskeletal:        General: Normal range of motion.     Cervical back: Normal range of motion.  Neurological:     Mental Status: She is alert and oriented to person, place, and time.     Cranial Nerves: No cranial nerve deficit.  Psychiatric:        Behavior: Behavior normal.  Vitals reviewed.    Results: No results found for this or any previous visit (from the past 24 hour(s)). PT WITH MENSTRUAL BLEEDING    Assessment/Plan: No diagnosis found.      GYN counsel breast self exam, mammography screening, adequate intake of calcium and vitamin D, diet and exercise     F/U  No follow-ups on file.  Yukio Bisping B. Fabian Walder, PA-C 06/09/2021 9:57 AM

## 2021-07-08 ENCOUNTER — Other Ambulatory Visit (HOSPITAL_COMMUNITY)
Admission: RE | Admit: 2021-07-08 | Discharge: 2021-07-08 | Disposition: A | Discharge status: Home / Self Care | Payer: Medicaid Other | Source: Ambulatory Visit | Attending: Obstetrics and Gynecology | Admitting: Obstetrics and Gynecology

## 2021-07-08 ENCOUNTER — Encounter: Payer: Self-pay | Admitting: Obstetrics and Gynecology

## 2021-07-08 ENCOUNTER — Ambulatory Visit (INDEPENDENT_AMBULATORY_CARE_PROVIDER_SITE_OTHER): Payer: Medicaid Other | Admitting: Obstetrics and Gynecology

## 2021-07-08 ENCOUNTER — Other Ambulatory Visit: Payer: Self-pay

## 2021-07-08 VITALS — BP 130/78 | Wt 380.0 lb

## 2021-07-08 DIAGNOSIS — Z1151 Encounter for screening for human papillomavirus (HPV): Secondary | ICD-10-CM | POA: Insufficient documentation

## 2021-07-08 DIAGNOSIS — Z124 Encounter for screening for malignant neoplasm of cervix: Secondary | ICD-10-CM | POA: Insufficient documentation

## 2021-07-08 DIAGNOSIS — Z3009 Encounter for other general counseling and advice on contraception: Secondary | ICD-10-CM

## 2021-07-08 DIAGNOSIS — Z01419 Encounter for gynecological examination (general) (routine) without abnormal findings: Secondary | ICD-10-CM

## 2021-07-08 DIAGNOSIS — Z0000 Encounter for general adult medical examination without abnormal findings: Secondary | ICD-10-CM

## 2021-07-08 DIAGNOSIS — Z1231 Encounter for screening mammogram for malignant neoplasm of breast: Secondary | ICD-10-CM

## 2021-07-08 DIAGNOSIS — Z8481 Family history of carrier of genetic disease: Secondary | ICD-10-CM

## 2021-07-08 NOTE — Patient Instructions (Signed)
I value your feedback and you entrusting us with your care. If you get a [facility] patient survey, I would appreciate you taking the time to let us know about your experience today. Thank you!  [facility]: [phone]  [facility]: [phone]    

## 2021-07-08 NOTE — Progress Notes (Signed)
PCP:  [name], MD   Chief Complaint  Patient presents with   Gynecologic Exam     HPI:      [name] is a 46 y.o. [id number] who LMP was Patient's last menstrual period was 06/18/2021., presents today for her annual examination.  Her menses are regular every 28-30 days, lasting 5-6 days, mod flow.  Dysmenorrhea mild, occurring first 1-2 days of flow, improved with NSAIDs. She does not have intermenstrual bleeding. Menses had stopped for about 2 yrs while taking med for pain, restarted 6 months ago.   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 Saw derm who did bx and referred pt to gen surg for excision. Plans to see them soon. Will then want nexplanon placement if going to be sex active again. Did Mirena in past.  Last Pap: 03/23/18 Results were: no abnormalities /neg HPV DNA. Hx of mult abn paps. 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   Last mammogram: 09/08/18 at [facility]. There is a strong FH of breast cancer on her mat side (mom, sister, mat aunt, niece). Affected niece is BRCA pos, pt is MyRisk neg 2018. IBIS=18%. There is a FH of ovarian cancer in her mat aunt. The patient does do self-breast exams.  Tobacco use: The patient currently smokes 1/2 packs of cigarettes per day for the past many years. Alcohol use: none No drug use.  Exercise: not active  She does get adequate calcium but not Vitamin D in her diet; hx of Vit D deficiency.  Labs with PCP   Past Medical History:  Diagnosis Date   BRCA negative 02/2017   MyRisk neg; IBIS=18% per my calculation; BRCA gene pos in family, pt is neg.   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) DNA test positive    COPD (chronic obstructive pulmonary disease) (HCC)    Diabetes mellitus without complication (HCC)    Family history of BRCA gene mutation    mat side   Family history of breast cancer    Family history of ovarian cancer    Genital warts    Hidradenitis  suppurativa    LGSIL on Pap smear of cervix 2014   Obesity    Sleep apnea     Past Surgical History:  Procedure Laterality Date   GALLBLADDER SURGERY      Family History  Problem Relation Age of Onset   Breast cancer Mother 19   Diabetes Mother    Breast cancer Sister 36   Breast cancer Maternal Aunt 101   Ovarian cancer Maternal Aunt 82   Breast cancer Other 28       BRCA pos (daughter of affected aunt with breast cancer)   Diabetes Father    Kidney disease Father     Social History   Socioeconomic History   Marital status: Single    Spouse name: Not on file   Number of children: Not on file   Years of education: Not on file   Highest education level: Not on file  Occupational History   Not on file  Tobacco Use   Smoking status: Every Day    Packs/day: 0.50    Types: Cigarettes   Smokeless tobacco: Never  Vaping Use   Vaping Use: Never used  Substance and Sexual Activity   Alcohol use: Yes    Comment: occassional   Drug use: No   Sexual activity: Yes    Birth control/protection:  Condom  Other Topics Concern   Not on file  Social History Narrative   Not on file   Social Determinants of Health   Financial Resource Strain: Not on file  Food Insecurity: Not on file  Transportation Needs: Not on file  Physical Activity: Not on file  Stress: Not on file  Social Connections: Not on file  Intimate Partner Violence: Not on file    Outpatient Medications Prior to Visit  Medication Sig Dispense Refill   albuterol (ACCUNEB) 0.63 MG/3ML nebulizer solution Take 1 ampule by nebulization every 6 (six) hours as needed for wheezing.     albuterol (PROVENTIL HFA;VENTOLIN HFA) 108 (90 Base) MCG/ACT inhaler Inhale 2 puffs into the lungs every 6 (six) hours as needed for wheezing or shortness of breath.     cyclobenzaprine (FLEXERIL) 10 MG tablet Take 10 mg by mouth 3 (three) times daily.      FLUoxetine (PROZAC) 40 MG capsule Take 40 mg by mouth daily.      gabapentin  (NEURONTIN) 300 MG capsule Take 300 mg by mouth 3 (three) times daily.      lisinopril-hydrochlorothiazide (PRINZIDE,ZESTORETIC) 10-12.5 MG tablet Take 1 tablet by mouth daily.     metFORMIN (GLUCOPHAGE) 1000 MG tablet Take 1,000 mg by mouth 2 (two) times daily with a meal.     rOPINIRole (REQUIP) 0.25 MG tablet Take 0.25 mg by mouth 3 (three) times daily.     Tiotropium Bromide-Olodaterol 2.5-2.5 MCG/ACT AERS Inhale into the lungs.     traZODone (DESYREL) 50 MG tablet Take 50 mg by mouth at bedtime.     tretinoin (RETIN-A) 0.05 % cream Apply topically.     Benzoyl Peroxide 10 % LIQD Apply topically. (Patient not taking: Reported on 07/08/2021)     clindamycin (CLEOCIN T) 1 % lotion Apply in am to affected areas (Patient not taking: Reported on 07/08/2021)     imiquimod (ALDARA) 5 % cream Apply M, W, F night, wash in AM. Can do up to 16 wks prn sx. (Patient not taking: Reported on 07/08/2021) 24 each 1   linagliptin (TRADJENTA) 5 MG TABS tablet Take by mouth. (Patient not taking: Reported on 07/08/2021)     meloxicam (MOBIC) 15 MG tablet Take by mouth. (Patient not taking: Reported on 07/08/2021)     METFORMIN & DIET MANAGE PROD PO metformin (Patient not taking: Reported on 07/08/2021)     pantoprazole (PROTONIX) 20 MG tablet Take by mouth. (Patient not taking: Reported on 07/08/2021)     pioglitazone (ACTOS) 15 MG tablet Take 15 mg by mouth daily. (Patient not taking: Reported on 07/08/2021)     pramipexole (MIRAPEX) 0.5 MG tablet Take by mouth. (Patient not taking: Reported on 07/08/2021)     spironolactone (ALDACTONE) 100 MG tablet Take by mouth. (Patient not taking: Reported on 07/08/2021)     No facility-administered medications prior to visit.     ROS:  Review of Systems  Constitutional:  Negative for fatigue, fever and unexpected weight change.  Respiratory:  Positive for cough, shortness of breath and wheezing.   Cardiovascular:  Negative for chest pain, palpitations and leg swelling.   Gastrointestinal:  Positive for constipation, diarrhea, nausea and vomiting. Negative for blood in stool.  Endocrine: Negative for cold intolerance, heat intolerance and polyuria.  Genitourinary:  Negative for dyspareunia, dysuria, flank pain, frequency, genital sores, hematuria, menstrual problem, pelvic pain, urgency, vaginal bleeding, vaginal discharge and vaginal pain.  Musculoskeletal:  Positive for arthralgias. Negative for back pain, joint swelling and  myalgias.  Skin:  Negative for rash.  Neurological:  Negative for dizziness, syncope, light-headedness, numbness and headaches.  Hematological:  Negative for adenopathy.  Psychiatric/Behavioral:  Positive for agitation and dysphoric mood. Negative for confusion, sleep disturbance and suicidal ideas. The patient is not nervous/anxious.  BREAST: No symptoms   Objective: BP 130/78 (BP Location: Left Arm, Patient Position: Sitting, Cuff Size: Large)    Wt (!) 380 lb (172.4 kg)    LMP 06/18/2021    BMI 61.33 kg/m    Physical Exam Constitutional:      Appearance: She is well-developed.  Genitourinary:     Vulva normal.     Right Labia: No rash, tenderness or lesions.    Left Labia: No tenderness, lesions or rash.    Vaginal bleeding present.     No vaginal discharge, erythema or tenderness.      Right Adnexa: not tender and no mass present.    Left Adnexa: not tender and no mass present.    No cervical motion tenderness, friability or polyp.     Uterus is not enlarged or tender.  Breasts:    Right: No mass, nipple discharge, skin change or tenderness.     Left: No mass, nipple discharge, skin change or tenderness.  Neck:     Thyroid: No thyromegaly.  Cardiovascular:     Rate and Rhythm: Normal rate and regular rhythm.     Heart sounds: Normal heart sounds. No murmur heard. Pulmonary:     Effort: Pulmonary effort is normal.     Breath sounds: Normal breath sounds.  Abdominal:     Palpations: Abdomen is soft.     Tenderness:  There is no abdominal tenderness. There is no guarding or rebound.  Musculoskeletal:        General: Normal range of motion.     Cervical back: Normal range of motion.  Lymphadenopathy:     Cervical: No cervical adenopathy.  Neurological:     General: No focal deficit present.     Mental Status: She is alert and oriented to person, place, and time.     Cranial Nerves: No cranial nerve deficit.  Skin:    General: Skin is warm and dry.  Psychiatric:        Mood and Affect: Mood normal.        Behavior: Behavior normal.        Thought Content: Thought content normal.        Judgment: Judgment normal.  Vitals reviewed.    Assessment/Plan: Encounter for annual routine gynecological examination  Cervical cancer screening - Plan: Cytology - PAP  Screening for HPV (human papillomavirus) - Plan: Cytology - PAP  Encounter for other general counseling or advice on contraception; discussed nexplanon. Will RTO with menses when desires it.  Encounter for screening mammogram for malignant neoplasm of breast - Plan: MM 3D SCREEN BREAST BILATERAL; pt to schedule mammo  Family history of BRCA gene positive--pt is BRCA/MyRisk neg. Cont routine mammo screening yearly.       GYN counsel breast self exam, mammography screening, adequate intake of calcium and vitamin D, diet and exercise     F/U  Return in about 1 year (around 07/08/2022).  Jessy Calixte B. Klara Stjames, PA-C 07/08/2021 4:03 PM

## 2021-07-09 ENCOUNTER — Telehealth: Payer: Self-pay | Admitting: Obstetrics and Gynecology

## 2021-07-09 NOTE — Telephone Encounter (Signed)
Pt called in and is wanting a RX for diflucan.  She forgot to ask for it during her appt.

## 2021-07-09 NOTE — Telephone Encounter (Signed)
Pt states it's not vaginal yeast, it's body yeast - under breasts; under belly and groin area, and butt crack; uses [facility].

## 2021-07-09 NOTE — Telephone Encounter (Signed)
What sx is she having? No mention of any sx yesterday at annual. Thx

## 2021-07-10 ENCOUNTER — Other Ambulatory Visit: Payer: Self-pay | Admitting: Obstetrics and Gynecology

## 2021-07-10 MED ORDER — FLUCONAZOLE 150 MG PO TABS: 150.0000 mg | ORAL_TABLET | Freq: Once | ORAL | 0 refills | Status: DC

## 2021-07-10 NOTE — Telephone Encounter (Signed)
Rx diflucan eRxd.

## 2021-07-10 NOTE — Telephone Encounter (Signed)
Pt aware.

## 2021-07-11 LAB — CYTOLOGY - PAP
Comment: NEGATIVE
Diagnosis: NEGATIVE
High risk HPV: NEGATIVE

## 2021-07-30 ENCOUNTER — Ambulatory Visit: Payer: Medicaid Other | Admitting: Surgery

## 2021-07-30 ENCOUNTER — Encounter: Payer: Self-pay | Admitting: Surgery

## 2021-07-30 ENCOUNTER — Ambulatory Visit: Payer: Self-pay | Admitting: Surgery

## 2021-07-30 ENCOUNTER — Other Ambulatory Visit: Payer: Self-pay

## 2021-07-30 VITALS — BP 163/77 | HR 97 | Temp 98.5°F | Ht 66.0 in | Wt 382.0 lb

## 2021-07-30 DIAGNOSIS — A630 Anogenital (venereal) warts: Secondary | ICD-10-CM | POA: Diagnosis not present

## 2021-07-30 DIAGNOSIS — Z6841 Body Mass Index (BMI) 40.0 and over, adult: Secondary | ICD-10-CM

## 2021-07-30 NOTE — Progress Notes (Signed)
Patient ID: Amy Cisneros, female   DOB: 05/14/1976, 45 y.o.   MRN: [id number] ° °Chief Complaint: Perianal condyloma ° °History of Present Illness °Amy Cisneros is a 45 y.o. 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  This area has been present for 3 years.  She has been seen by dermatology, areas been frozen on 2 occasions.  She is attempted topical use without success, primarily due to an inability to apply the medication to the area.  Recent biopsy was completed and there was bleeding there and she was told that it was very vascular.  She presents today to pursue excision. ° °Past Medical History °Past Medical History:  °Diagnosis Date  ° BRCA negative 02/2017  ° MyRisk neg; IBIS=18% per my calculation; BRCA gene pos in family, pt is neg.  ° 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) DNA test positive   ° COPD (chronic obstructive pulmonary disease) (HCC)   ° Diabetes mellitus without complication (HCC)   ° Family history of BRCA gene mutation   ° mat side  ° Family history of breast cancer   ° Family history of ovarian cancer   ° Genital warts   ° Hidradenitis suppurativa   ° LGSIL on Pap smear of cervix 2014  ° Obesity   ° Sleep apnea   °  ° ° °Past Surgical History:  °Procedure Laterality Date  ° GALLBLADDER SURGERY    ° ° °Allergies  °Allergen Reactions  ° Codeine Hives  ° Morphine Hives  ° ° °Current Outpatient Medications  °Medication Sig Dispense Refill  ° albuterol (ACCUNEB) 0.63 MG/3ML nebulizer solution Take 1 ampule by nebulization every 6 (six) hours as needed for wheezing.    ° albuterol (PROVENTIL HFA;VENTOLIN HFA) 108 (90 Base) MCG/ACT inhaler Inhale 2 puffs into the lungs every 6 (six) hours as needed for wheezing or shortness of breath.    ° cyclobenzaprine (FLEXERIL) 10 MG tablet Take 10 mg by mouth 3 (three) times daily.     ° FLUoxetine (PROZAC) 40 MG capsule Take 40 mg by mouth daily.     ° gabapentin (NEURONTIN) 300 MG capsule Take 300 mg by mouth 3 (three)  times daily.     ° lisinopril-hydrochlorothiazide (PRINZIDE,ZESTORETIC) 10-12.5 MG tablet Take 1 tablet by mouth daily.    ° metFORMIN (GLUCOPHAGE) 1000 MG tablet Take 1,000 mg by mouth 2 (two) times daily with a meal.    ° rOPINIRole (REQUIP) 0.25 MG tablet Take 0.25 mg by mouth 3 (three) times daily.    ° Tiotropium Bromide-Olodaterol 2.5-2.5 MCG/ACT AERS Inhale into the lungs.    ° traZODone (DESYREL) 50 MG tablet Take 50 mg by mouth at bedtime.    ° tretinoin (RETIN-A) 0.05 % cream Apply topically.    ° °No current facility-administered medications for this visit.  ° ° °Family History °Family History  °Problem Relation Age of Onset  ° Breast cancer Mother 51  ° Diabetes Mother   ° Breast cancer Sister 34  ° Breast cancer Maternal Aunt 40  ° Ovarian cancer Maternal Aunt 50  ° Breast cancer Other 26  °     BRCA pos (daughter of affected aunt with breast cancer)  ° Diabetes Father   ° Kidney disease Father   °  ° ° °Social History °Social History  ° °Tobacco Use  ° Smoking status: Every Day  °  Packs/day: 0.50  °  Types: Cigarettes  ° Smokeless tobacco: Never  °Vaping Use  °   Vaping Use: Some days  °Substance Use Topics  ° Alcohol use: Yes  °  Comment: occassional  ° Drug use: No  °  °  ° ° °Review of Systems  °Constitutional: Negative.   °HENT: Negative.    °Eyes: Negative.   °Respiratory:  Positive for cough and shortness of breath.   °Cardiovascular: Negative.   °Gastrointestinal: Negative.   °Genitourinary: Negative.   °Skin: Negative.   °Neurological: Negative.   °Psychiatric/Behavioral:  Positive for depression.   °  ° °Physical Exam °Blood pressure (!) 163/77, pulse 97, temperature 98.5 °F (36.9 °C), height 5' 6" (1.676 m), weight (!) 382 lb (173.3 kg), last menstrual period 07/09/2021, SpO2 97 %. °Last Weight  Most recent update: 07/30/2021  3:55 PM  ° ° Weight  °173.3 kg (382 lb)    °      ° °  ° ° °CONSTITUTIONAL: Well developed, and nourished, morbidly obese, appropriately responsive and aware without  distress.   °EYES: Sclera non-icteric.   °EARS, NOSE, MOUTH AND THROAT: Mask worn.    Hearing is intact to voice.  °NECK: Trachea is midline, and there is no jugular venous distension.  °LYMPH NODES:  Lymph nodes in the neck are not enlarged. °RESPIRATORY:  Lungs are clear, and breath sounds are equal bilaterally. Normal respiratory effort without pathologic use of accessory muscles. °CARDIOVASCULAR: Heart is regular in rate and rhythm. °GI: The abdomen is obese, otherwise soft, nontender, and nondistended. There were no palpable masses. I did not appreciate hepatosplenomegaly.  °GU: Caryl Lyn present as chaperone.  On the right perianal skin approximately 1 to 2 cm from the anal canal there are 2 wide-based plaques of confluent condylomatous lesions.  The largest is approximately 5 cm in diameter, and a trapezoid shape.  Posterior to this separated by a subcentimeter stripe of normal skin there is a triangular shaped plaque that is 3 cm in widest dimension.  None of these areas are ulcerated.  There is no appreciable condyloma on the opposite side.  There is no appreciable perineal or vulvar condyloma.   °MUSCULOSKELETAL:  Symmetrical muscle tone appreciated in all four extremities.    °SKIN: Skin turgor is normal. No pathologic skin lesions appreciated.  °NEUROLOGIC:  Motor and sensation appear grossly normal.  Cranial nerves are grossly without defect. °PSYCH:  Alert and oriented to person, place and time. Affect is appropriate for situation. ° °Data Reviewed °I have personally reviewed what is currently available of the patient's imaging, recent labs and medical records.   °Labs:  °CBC Latest Ref Rng & Units 09/22/2018 04/25/2012  °WBC 4.0 - 10.5 K/uL 8.2 12.6(H)  °Hemoglobin 12.0 - 15.0 g/dL 10.9(L) 12.5  °Hematocrit 36.0 - 46.0 % 34.2(L) 38.3  °Platelets 150 - 400 K/uL 331 327  ° °CMP Latest Ref Rng & Units 09/22/2018 02/09/2017 04/25/2012  °Glucose 70 - 99 mg/dL 212(H) 148(H) 132(H)  °BUN 6 - 20 mg/dL 34(H) 14 17   °Creatinine 0.44 - 1.00 mg/dL 1.28(H) 0.76 1.05  °Sodium 135 - 145 mmol/L 134(L) 138 142  °Potassium 3.5 - 5.1 mmol/L 4.5 4.7 4.3  °Chloride 98 - 111 mmol/L 103 98 108(H)  °CO2 22 - 32 mmol/L 24 26 26  °Calcium 8.9 - 10.3 mg/dL 9.3 9.8 8.4(L)  °Total Protein 6.5 - 8.1 g/dL 7.0 7.4 6.9  °Total Bilirubin 0.3 - 1.2 mg/dL <0.1(L) <0.2 0.2  °Alkaline Phos 38 - 126 U/L 63 95 93  °AST 15 - 41 U/L 26 35 29  °ALT   0 - 44 U/L 31 50(H) 36  ° ° ° ° °Imaging: ° °Within last 24 hrs: No results found. ° °Assessment °   °Peri-anal condyloma, 5 and 3 cm.  °Patient Active Problem List  ° Diagnosis Date Noted  ° Genital warts 03/23/2018  ° BRCA negative 04/25/2017  ° Chronic lower extremity pain (Secondary Area of Pain) (Bilateral) (R>L) 02/09/2017  ° Chronic knee pain (Tertiary Area of Pain) (Bilateral) (R>L) 02/09/2017  ° Chronic hip pain (Fourth Area of Pain) (Bilateral) (R>L) 02/09/2017  ° Chronic shoulder pain (Fifth Area of Pain) (Bilateral) (R>L) 02/09/2017  ° Chronic neck pain (Bilateral) (R>L) 02/09/2017  ° NSAID long-term use 02/09/2017  ° Osteoarthritis of knee 02/08/2017  ° Chronic pain syndrome 02/08/2017  ° Opiate use 02/08/2017  °  Class: History of  ° Lumbar paracentral disc bulge at L2-3 (Left) 02/08/2017  ° Lumbar facet hypertrophy (Bilateral) 02/08/2017  ° Chronic lumbar radiculitis of L3 (Left) 02/08/2017  ° Chronic low back pain (Primary Area of Pain) (Bilateral) (L>R) 02/08/2017  ° Morbid obesity with BMI of 50.0-59.9, adult (HCC) 02/08/2017  ° Family history of breast cancer 02/01/2017  ° 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) DNA test positive 02/01/2017  ° Family history of BRCA gene positive 02/01/2017  ° Dyspepsia 03/26/2014  ° IBS (irritable bowel syndrome) 03/26/2014  ° ° °Plan °   °Excision of peri-anal condyloma, areas of 5 cm and 3 cm.  ° °Risks of recurrence, bleeding, infection, anesthesia, and malignant transformation d/w pt.  Questions elicited and answered.  No guarantees ever expressed or  implied.  ° °Face-to-face time spent with the patient and accompanying care providers(if present) was 30 minutes, with more than 50% of the time spent counseling, educating, and coordinating care of the patient.   ° °These notes generated with voice recognition software. I apologize for typographical errors. ° °Mayela Bullard M.D., FACS °07/30/2021, 4:23 PM ° ° ° ° °

## 2021-07-30 NOTE — Patient Instructions (Signed)
We have spoken to you today about having a procedure/surgery at the hospital. This will be done at [facility] by Dr Claudine Mouton.  Please see your Blue surgery sheet. Our surgery scheduler will call you to look at surgery dates and to go over information.    Genital Warts Genital warts are a common STI (sexually transmitted infection). They may appear as small bumps on the skin of the genital and anal areas. They sometimes become irritated and cause pain. Genital warts are easily passed to other people through sexual contact. Many people do not know that they are infected, and they may be infected for years without symptoms. Even without symptoms, they can pass the infection to their sexual partners. What are the causes? This condition is caused by a virus that is called human papillomavirus (HPV). HPV is spread by having unprotected sex with an infected person. It can be spread through vaginal, anal, and oral sex. What increases the risk? You are more likely to develop this condition if: You have unprotected sex. You have multiple sexual partners. You are sexually active before age 27. You are a man who is not circumcised. You have a female sexual partner who is not circumcised. You have a weakened body defense system (immune system) due to disease or medicine. What are the signs or symptoms? Symptoms of this condition include: Small growths in the genital area or anal area. These warts often grow in clusters. Itching and irritation in the genital area or anal area. Bleeding from the warts. Pain during sex. How is this diagnosed? This condition is diagnosed based on your symptoms and a physical exam. You may also have other tests, including: Biopsy. A tissue sample is removed so it can be checked under a microscope. Colposcopy. In females, a magnifying tool is used to examine the vagina and cervix. Certain solutions may be used to make the HPV cells change color so they can be seen more easily. A  Pap test in females. Tests for other STIs. How is this treated? This condition may be treated with: Medicines, such as solutions or creams that are applied to your skin (topical). Procedures, such as: Freezing the warts with liquid nitrogen (cryotherapy). Burning the warts with a laser or electric probe (electrocautery). Surgery to remove the warts. Getting treatment is important because genital warts can lead to other problems. In females, the virus that causes genital warts may increase the risk for cervical cancer. Follow these instructions at home: Medicines  Apply over-the-counter and prescription medicines only as told by your health care provider. Do not treat genital warts with medicines that are used for treating hand warts. Talk with your health care provider about using over-the-counter anti-itch creams. Instructions for women Get screened regularly for cervical cancer. This type of cancer is slow growing and can almost always be cured if it is found early. If you become pregnant, tell your health care provider that you have had an HPV infection. Your health care provider will monitor you closely during pregnancy. General instructions Do not touch or scratch the warts. Do not have sex until your treatment has been completed. Tell your current and past sexual partners about your condition because they may also need treatment. After treatment, use condoms during sex to prevent future infections. Keep all follow-up visits as told by your health care provider. This is important. How is this prevented? Talk with your health care provider about getting the HPV vaccine. The vaccine: Can prevent some HPV infections and cancers.  Is recommended for males and females who are 8-25 years old. Is not recommended for pregnant women. Will not work if you already have HPV. Contact a health care provider if you: Have redness, swelling, or pain in the area of the treated skin. Have a  fever. Feel generally ill. Feel lumps in and around your genital or anal area. Have bleeding in your genital or anal area. Have pain during sex or bleeding after sex. Summary Genital warts are a common STI (sexually transmitted infection). It may appear as small bumps on the genital and anal areas. This condition is caused by a virus that is called human papillomavirus (HPV). HPV is spread by having unprotected sex with an infected person. It can be spread through vaginal, anal, and oral sex. Treatment is important because genital warts can lead to other problems. In females, the virus that causes genital warts may increase the risk for cervical cancer. This condition may be treated with medicine that is applied to the skin or procedures to remove the warts. The HPV vaccine can prevent some HPV infections and cancers. It is recommended that the vaccine be given to males and females who are 48-62 years old. This information is not intended to replace advice given to you by your health care provider. Make sure you discuss any questions you have with your health care provider. Document Revised: 05/03/2019 Document Reviewed: 05/03/2019 Elsevier Patient Education  2022 [employer].

## 2021-07-30 NOTE — H&P (View-Only) (Signed)
Patient ID: Amy Cisneros, female   DOB: 1976/01/16, 46 y.o.   MRN: [id number]  Chief Complaint: Perianal condyloma  History of Present Illness Amy Cisneros is a 46 y.o. 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  This area has been present for 3 years.  She has been seen by dermatology, areas been frozen on 2 occasions.  She is attempted topical use without success, primarily due to an inability to apply the medication to the area.  Recent biopsy was completed and there was bleeding there and she was told that it was very vascular.  She presents today to pursue excision.  Past Medical History Past Medical History:  Diagnosis Date   BRCA negative 02/2017   MyRisk neg; IBIS=18% per my calculation; BRCA gene pos in family, pt is neg.   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) DNA test positive    COPD (chronic obstructive pulmonary disease) (HCC)    Diabetes mellitus without complication ([place])    Family history of BRCA gene mutation    mat side   Family history of breast cancer    Family history of ovarian cancer    Genital warts    Hidradenitis suppurativa    LGSIL on Pap smear of cervix 2014   Obesity    Sleep apnea       Past Surgical History:  Procedure Laterality Date   GALLBLADDER SURGERY      Allergies  Allergen Reactions   Codeine Hives   Morphine Hives    Current Outpatient Medications  Medication Sig Dispense Refill   albuterol (ACCUNEB) 0.63 MG/3ML nebulizer solution Take 1 ampule by nebulization every 6 (six) hours as needed for wheezing.     albuterol (PROVENTIL HFA;VENTOLIN HFA) 108 (90 Base) MCG/ACT inhaler Inhale 2 puffs into the lungs every 6 (six) hours as needed for wheezing or shortness of breath.     cyclobenzaprine (FLEXERIL) 10 MG tablet Take 10 mg by mouth 3 (three) times daily.      FLUoxetine (PROZAC) 40 MG capsule Take 40 mg by mouth daily.      gabapentin (NEURONTIN) 300 MG capsule Take 300 mg by mouth 3 (three)  times daily.      lisinopril-hydrochlorothiazide (PRINZIDE,ZESTORETIC) 10-12.5 MG tablet Take 1 tablet by mouth daily.     metFORMIN (GLUCOPHAGE) 1000 MG tablet Take 1,000 mg by mouth 2 (two) times daily with a meal.     rOPINIRole (REQUIP) 0.25 MG tablet Take 0.25 mg by mouth 3 (three) times daily.     Tiotropium Bromide-Olodaterol 2.5-2.5 MCG/ACT AERS Inhale into the lungs.     traZODone (DESYREL) 50 MG tablet Take 50 mg by mouth at bedtime.     tretinoin (RETIN-A) 0.05 % cream Apply topically.     No current facility-administered medications for this visit.    Family History Family History  Problem Relation Age of Onset   Breast cancer Mother 51   Diabetes Mother    Breast cancer Sister 23   Breast cancer Maternal Aunt 96   Ovarian cancer Maternal Aunt 67   Breast cancer Other 51       BRCA pos (daughter of affected aunt with breast cancer)   Diabetes Father    Kidney disease Father       Social History Social History   Tobacco Use   Smoking status: Every Day    Packs/day: 0.50    Types: Cigarettes   Smokeless tobacco: Never  Vaping Use  Vaping Use: Some days  Substance Use Topics   Alcohol use: Yes    Comment: occassional   Drug use: No        Review of Systems  Constitutional: Negative.   HENT: Negative.    Eyes: Negative.   Respiratory:  Positive for cough and shortness of breath.   Cardiovascular: Negative.   Gastrointestinal: Negative.   Genitourinary: Negative.   Skin: Negative.   Neurological: Negative.   Psychiatric/Behavioral:  Positive for depression.      Physical Exam Blood pressure (!) 163/77, pulse 97, temperature 98.5 F (36.9 C), height _0  (1.676 m), weight (!) 382 lb (173.3 kg), last menstrual period 07/09/2021, SpO2 97 %. Last Weight  Most recent update: 07/30/2021  3:55 PM    Weight  173.3 kg (382 lb)               CONSTITUTIONAL: Well developed, and nourished, morbidly obese, appropriately responsive and aware without  distress.   EYES: Sclera non-icteric.   EARS, NOSE, MOUTH AND THROAT: Mask worn.    Hearing is intact to voice.  NECK: Trachea is midline, and there is no jugular venous distension.  LYMPH NODES:  Lymph nodes in the neck are not enlarged. RESPIRATORY:  Lungs are clear, and breath sounds are equal bilaterally. Normal respiratory effort without pathologic use of accessory muscles. CARDIOVASCULAR: Heart is regular in rate and rhythm. GI: The abdomen is obese, otherwise soft, nontender, and nondistended. There were no palpable masses. I did not appreciate hepatosplenomegaly.  GU: Amy Cisneros present as chaperone.  On the right perianal skin approximately 1 to 2 cm from the anal canal there are 2 wide-based plaques of confluent condylomatous lesions.  The largest is approximately 5 cm in diameter, and a trapezoid shape.  Posterior to this separated by a subcentimeter stripe of normal skin there is a triangular shaped plaque that is 3 cm in widest dimension.  None of these areas are ulcerated.  There is no appreciable condyloma on the opposite side.  There is no appreciable perineal or vulvar condyloma.   MUSCULOSKELETAL:  Symmetrical muscle tone appreciated in all four extremities.    SKIN: Skin turgor is normal. No pathologic skin lesions appreciated.  NEUROLOGIC:  Motor and sensation appear grossly normal.  Cranial nerves are grossly without defect. PSYCH:  Alert and oriented to person, place and time. Affect is appropriate for situation.  Data Reviewed I have personally reviewed what is currently available of the patient's imaging, recent labs and medical records.   Labs:  CBC Latest Ref Rng & Units 09/22/2018 04/25/2012  WBC 4.0 - 10.5 K/uL 8.2 12.6(H)  Hemoglobin 12.0 - 15.0 g/dL 10.9(L) 12.5  Hematocrit 36.0 - 46.0 % 34.2(L) 38.3  Platelets 150 - 400 K/uL 331 327   CMP Latest Ref Rng & Units 09/22/2018 02/09/2017 04/25/2012  Glucose 70 - 99 mg/dL 212(H) 148(H) 132(H)  BUN 6 - 20 mg/dL 34(H) 14 17   Creatinine 0.44 - 1.00 mg/dL 1.28(H) 0.76 1.05  Sodium 135 - 145 mmol/L 134(L) 138 142  Potassium 3.5 - 5.1 mmol/L 4.5 4.7 4.3  Chloride 98 - 111 mmol/L 103 98 108(H)  CO2 22 - 32 mmol/L _1 Calcium 8.9 - 10.3 mg/dL 9.3 9.8 8.4(L)  Total Protein 6.5 - 8.1 g/dL 7.0 7.4 6.9  Total Bilirubin 0.3 - 1.2 mg/dL <0.1(L) <0.2 0.2  Alkaline Phos 38 - 126 U/L 63 95 93  AST 15 - 41 U/L 26 35 29  ALT  0 - 44 U/L 31 50(H) 36      Imaging:  Within last 24 hrs: No results found.  Assessment    Peri-anal condyloma, 5 and 3 cm.  Patient Active Problem List   Diagnosis Date Noted   Genital warts 03/23/2018   BRCA negative 04/25/2017   Chronic lower extremity pain (Secondary Area of Pain) (Bilateral) (R>L) 02/09/2017   Chronic knee pain [facility] Area of Pain) (Bilateral) (R>L) 02/09/2017   Chronic hip pain (Fourth Area of Pain) (Bilateral) (R>L) 02/09/2017   Chronic shoulder pain (Fifth Area of Pain) (Bilateral) (R>L) 02/09/2017   Chronic neck pain (Bilateral) (R>L) 02/09/2017   NSAID long-term use 02/09/2017   Osteoarthritis of knee 02/08/2017   Chronic pain syndrome 02/08/2017   Opiate use 02/08/2017    Class: History of   Lumbar paracentral disc bulge at L2-3 (Left) 02/08/2017   Lumbar facet hypertrophy (Bilateral) 02/08/2017   Chronic lumbar radiculitis of L3 (Left) 02/08/2017   Chronic low back pain (Primary Area of Pain) (Bilateral) (L>R) 02/08/2017   Morbid obesity with BMI of 50.0-59.9, adult ([place]) 02/08/2017   Family history of breast cancer 02/01/2017   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) DNA test positive 02/01/2017   Family history of BRCA gene positive 02/01/2017   Dyspepsia 03/26/2014   IBS (irritable bowel syndrome) 03/26/2014    Plan    Excision of peri-anal condyloma, areas of 5 cm and 3 cm.   Risks of recurrence, bleeding, infection, anesthesia, and malignant transformation d/w pt.  Questions elicited and answered.  No guarantees ever expressed or  implied.   Face-to-face time spent with the patient and accompanying care providers(if present) was 30 minutes, with more than 50% of the time spent counseling, educating, and coordinating care of the patient.    These notes generated with voice recognition software. I apologize for typographical errors.  Ronny Bacon M.D., FACS 07/30/2021, 4:23 PM

## 2021-07-31 ENCOUNTER — Telehealth: Payer: Self-pay | Admitting: Surgery

## 2021-07-31 NOTE — Telephone Encounter (Signed)
Patient has been advised of Pre-Admission date/time, COVID Testing date and Surgery date.  Surgery Date: 08/12/21 Preadmission Testing Date: 08/07/21 (phone 7:30 am -1:00 pm) Covid Testing Date: Not needed.    Patient has been made aware to call [phone], between 1-3:00pm the day before surgery, to find out what time to arrive for surgery.

## 2021-08-07 ENCOUNTER — Encounter
Admission: RE | Admit: 2021-08-07 | Discharge: 2021-08-07 | Disposition: A | Discharge status: Home / Self Care | Payer: Medicaid Other | Source: Ambulatory Visit | Attending: Surgery | Admitting: Surgery

## 2021-08-07 ENCOUNTER — Other Ambulatory Visit: Payer: Self-pay

## 2021-08-07 HISTORY — DX: Anemia, unspecified: D64.9

## 2021-08-07 HISTORY — DX: Fatty (change of) liver, not elsewhere classified: K76.0

## 2021-08-07 HISTORY — DX: Polyneuropathy, unspecified: G62.9

## 2021-08-07 NOTE — Patient Instructions (Addendum)
Your procedure is scheduled on: 08/12/21 Report to DAY SURGERY DEPARTMENT LOCATED ON 2ND FLOOR MEDICAL MALL ENTRANCE. To find out your arrival time please call [phone] between 1PM - 3PM on 08/11/21 .  Remember: Instructions that are not followed completely may result in serious medical risk, up to and including death, or upon the discretion of your surgeon and anesthesiologist your surgery may need to be rescheduled.     _X__ 1. Do not eat food or drink any liquids after midnight the night before your procedure.                 No gum chewing or hard candies.   __X__2.  On the morning of surgery brush your teeth with toothpaste and water, you                 may rinse your mouth with mouthwash if you wish.  Do not swallow any              toothpaste of mouthwash.     _X__ 3.  No Alcohol for 24 hours before or after surgery.   _X__ 4.  Do Not Smoke or use e-cigarettes For 24 Hours Prior to Your Surgery.                 Do not use any chewable tobacco products for at least 6 hours prior to                 surgery.  ____  5.  Bring all medications with you on the day of surgery if instructed.   __X__  6.  Notify your doctor if there is any change in your medical condition      (cold, fever, infections).     Do not wear jewelry, make-up, hairpins, clips or nail polish. Do not wear lotions, powders, or perfumes.  Do not shave body hair 48 hours prior to surgery. Men may shave face and neck. Do not bring valuables to the hospital.    [facility] is not responsible for any belongings or valuables.  Contacts, dentures/partials or body piercings may not be worn into surgery. Bring a case for your contacts, glasses or hearing aids, a denture cup will be supplied. Leave your suitcase in the car. After surgery it may be brought to your room. For patients admitted to the hospital, discharge time is determined by your treatment team.   Patients discharged the day of surgery will not be allowed  to drive home.   Please read over the following fact sheets that you were given:     __X__ Take these medicines the morning of surgery with A SIP OF WATER:    1. FLUoxetine (PROZAC) 20 MG capsule  2. pregabalin (LYRICA) 100 MG capsule  3.   4.  5.  6.  ____ Fleet Enema (as directed)   ____ Use CHG Soap/SAGE wipes as directed  __X__ Use inhalers on the day of surgery  USE YOUR NEBULIZER THE DAY OF YOUR SURGERY  __X__ Stop metformin/Janumet/Farxiga 2 days prior to surgery  Last dose Monday morning 08/10/21, restart after surgery.  ____ Take 1/2 of usual insulin dose the night before surgery. No insulin the morning          of surgery.   ____ Stop Blood Thinners Coumadin/Plavix/Xarelto/Pleta/Pradaxa/Eliquis/Effient/Aspirin  on   Or contact your Surgeon, Cardiologist or Medical Doctor regarding  ability to stop your blood thinners  __X__ Stop Anti-inflammatories 7 days before surgery such  as Advil, Ibuprofen, Motrin,  BC or Goodies Powder, Naprosyn, Naproxen, Aleve, Aspirin    __X__ Stop all herbals and supplements, fish oil or vitamins  until after surgery.    __X__ Bring C-Pap to the hospital.      HOLD YOUR Buprenorphine HCl-Naloxone HCl 8-2 MG FILM THE DAY OF SURGERY. MAY RESTART ONCE HOME AFTER SURGERY.

## 2021-08-10 ENCOUNTER — Other Ambulatory Visit: Payer: Self-pay

## 2021-08-10 ENCOUNTER — Other Ambulatory Visit
Admission: RE | Admit: 2021-08-10 | Discharge: 2021-08-10 | Disposition: A | Discharge status: Home / Self Care | Payer: Medicaid Other | Source: Ambulatory Visit | Attending: Surgery | Admitting: Surgery

## 2021-08-10 DIAGNOSIS — I10 Essential (primary) hypertension: Secondary | ICD-10-CM | POA: Diagnosis not present

## 2021-08-10 DIAGNOSIS — Z01810 Encounter for preprocedural cardiovascular examination: Secondary | ICD-10-CM | POA: Insufficient documentation

## 2021-08-10 DIAGNOSIS — E669 Obesity, unspecified: Secondary | ICD-10-CM | POA: Insufficient documentation

## 2021-08-12 ENCOUNTER — Ambulatory Visit: Payer: Medicaid Other | Admitting: Anesthesiology

## 2021-08-12 ENCOUNTER — Encounter: Payer: Self-pay | Admitting: Surgery

## 2021-08-12 ENCOUNTER — Ambulatory Visit
Admission: RE | Admit: 2021-08-12 | Discharge: 2021-08-12 | Disposition: A | Discharge status: Home / Self Care | Payer: Medicaid Other | Attending: Surgery | Admitting: Surgery

## 2021-08-12 ENCOUNTER — Other Ambulatory Visit: Payer: Self-pay

## 2021-08-12 ENCOUNTER — Encounter
Admission: RE | Disposition: A | Discharge status: Home / Self Care | Payer: Self-pay | Source: Home / Self Care | Attending: Surgery

## 2021-08-12 DIAGNOSIS — G629 Polyneuropathy, unspecified: Secondary | ICD-10-CM | POA: Insufficient documentation

## 2021-08-12 DIAGNOSIS — G4730 Sleep apnea, unspecified: Secondary | ICD-10-CM | POA: Diagnosis not present

## 2021-08-12 DIAGNOSIS — F17210 Nicotine dependence, cigarettes, uncomplicated: Secondary | ICD-10-CM | POA: Insufficient documentation

## 2021-08-12 DIAGNOSIS — E119 Type 2 diabetes mellitus without complications: Secondary | ICD-10-CM | POA: Insufficient documentation

## 2021-08-12 DIAGNOSIS — K760 Fatty (change of) liver, not elsewhere classified: Secondary | ICD-10-CM | POA: Diagnosis not present

## 2021-08-12 DIAGNOSIS — Z7984 Long term (current) use of oral hypoglycemic drugs: Secondary | ICD-10-CM | POA: Diagnosis not present

## 2021-08-12 DIAGNOSIS — J449 Chronic obstructive pulmonary disease, unspecified: Secondary | ICD-10-CM | POA: Diagnosis not present

## 2021-08-12 DIAGNOSIS — Z6841 Body Mass Index (BMI) 40.0 and over, adult: Secondary | ICD-10-CM | POA: Diagnosis not present

## 2021-08-12 DIAGNOSIS — A630 Anogenital (venereal) warts: Secondary | ICD-10-CM | POA: Insufficient documentation

## 2021-08-12 HISTORY — PX: CONDYLOMA EXCISION/FULGURATION: SHX1389

## 2021-08-12 LAB — GLUCOSE, CAPILLARY
Glucose-Capillary: 166 mg/dL — ABNORMAL HIGH (ref 70–99)
Glucose-Capillary: 148 mg/dL — ABNORMAL HIGH (ref 70–99)

## 2021-08-12 LAB — POCT PREGNANCY, URINE: Preg Test, Ur: NEGATIVE

## 2021-08-12 SURGERY — REMOVAL, CONDYLOMA
Anesthesia: General | Laterality: Right

## 2021-08-12 MED ORDER — ONDANSETRON HCL 4 MG/2ML IJ SOLN
INTRAMUSCULAR | Status: AC
  Filled 2021-08-12: qty 2

## 2021-08-12 MED ORDER — 0.9 % SODIUM CHLORIDE (POUR BTL) OPTIME
TOPICAL | Status: DC | PRN
  Administered 2021-08-12: 100 mL

## 2021-08-12 MED ORDER — DIBUCAINE (PERIANAL) 1 % EX OINT
TOPICAL_OINTMENT | CUTANEOUS | Status: AC
  Filled 2021-08-12: qty 28

## 2021-08-12 MED ORDER — DEXMEDETOMIDINE (PRECEDEX) IN NS 20 MCG/5ML (4 MCG/ML) IV SYRINGE
PREFILLED_SYRINGE | INTRAVENOUS | Status: DC | PRN
  Administered 2021-08-12: 16 ug via INTRAVENOUS
  Administered 2021-08-12: 4 ug via INTRAVENOUS

## 2021-08-12 MED ORDER — CHLORHEXIDINE GLUCONATE CLOTH 2 % EX PADS: 6.0000 | MEDICATED_PAD | Freq: Once | CUTANEOUS | Status: DC

## 2021-08-12 MED ORDER — ONDANSETRON HCL 4 MG/2ML IJ SOLN: 4.0000 mg | Freq: Once | INTRAMUSCULAR | Status: DC | PRN

## 2021-08-12 MED ORDER — SUGAMMADEX SODIUM 500 MG/5ML IV SOLN
INTRAVENOUS | Status: AC
  Filled 2021-08-12: qty 5

## 2021-08-12 MED ORDER — ACETAMINOPHEN 500 MG PO TABS
ORAL_TABLET | ORAL | Status: AC
  Administered 2021-08-12: 1000 mg via ORAL
  Filled 2021-08-12: qty 2

## 2021-08-12 MED ORDER — FAMOTIDINE 20 MG PO TABS
ORAL_TABLET | ORAL | Status: AC
  Administered 2021-08-12: 20 mg via ORAL
  Filled 2021-08-12: qty 1

## 2021-08-12 MED ORDER — SUCCINYLCHOLINE CHLORIDE 200 MG/10ML IV SOSY
PREFILLED_SYRINGE | INTRAVENOUS | Status: DC | PRN
  Administered 2021-08-12: 140 mg via INTRAVENOUS

## 2021-08-12 MED ORDER — ACETAMINOPHEN 10 MG/ML IV SOLN: 1000.0000 mg | Freq: Once | INTRAVENOUS | Status: DC | PRN

## 2021-08-12 MED ORDER — BUPIVACAINE-EPINEPHRINE 0.25% -1:200000 IJ SOLN
INTRAMUSCULAR | Status: DC | PRN
  Administered 2021-08-12: 30 mL

## 2021-08-12 MED ORDER — LACTATED RINGERS IV SOLN: INTRAVENOUS | Status: DC

## 2021-08-12 MED ORDER — CELECOXIB 200 MG PO CAPS
ORAL_CAPSULE | ORAL | Status: AC
  Filled 2021-08-12: qty 1

## 2021-08-12 MED ORDER — GABAPENTIN 300 MG PO CAPS: 300.0000 mg | ORAL_CAPSULE | ORAL | Status: AC

## 2021-08-12 MED ORDER — PROPOFOL 500 MG/50ML IV EMUL
INTRAVENOUS | Status: DC | PRN
  Administered 2021-08-12: 50 ug/kg/min via INTRAVENOUS

## 2021-08-12 MED ORDER — SODIUM CHLORIDE 0.9 % IV SOLN: INTRAVENOUS | Status: DC

## 2021-08-12 MED ORDER — ONDANSETRON HCL 4 MG/2ML IJ SOLN
INTRAMUSCULAR | Status: DC | PRN
  Administered 2021-08-12: 4 mg via INTRAVENOUS

## 2021-08-12 MED ORDER — BUPIVACAINE-EPINEPHRINE (PF) 0.25% -1:200000 IJ SOLN
INTRAMUSCULAR | Status: AC
  Filled 2021-08-12: qty 30

## 2021-08-12 MED ORDER — ROCURONIUM BROMIDE 100 MG/10ML IV SOLN
INTRAVENOUS | Status: DC | PRN
  Administered 2021-08-12: 20 mg via INTRAVENOUS

## 2021-08-12 MED ORDER — CEFAZOLIN IN SODIUM CHLORIDE 3-0.9 GM/100ML-% IV SOLN
3.0000 g | INTRAVENOUS | Status: AC
  Administered 2021-08-12: 3 g via INTRAVENOUS
  Filled 2021-08-12: qty 100

## 2021-08-12 MED ORDER — CHLORHEXIDINE GLUCONATE 0.12 % MT SOLN
OROMUCOSAL | Status: AC
  Administered 2021-08-12: 15 mL via OROMUCOSAL
  Filled 2021-08-12: qty 15

## 2021-08-12 MED ORDER — FENTANYL CITRATE (PF) 100 MCG/2ML IJ SOLN: 25.0000 ug | INTRAMUSCULAR | Status: DC | PRN

## 2021-08-12 MED ORDER — MIDAZOLAM HCL 2 MG/2ML IJ SOLN
INTRAMUSCULAR | Status: AC
  Filled 2021-08-12: qty 2

## 2021-08-12 MED ORDER — SUGAMMADEX SODIUM 500 MG/5ML IV SOLN
INTRAVENOUS | Status: DC | PRN
  Administered 2021-08-12: 350 mg via INTRAVENOUS

## 2021-08-12 MED ORDER — DEXAMETHASONE SODIUM PHOSPHATE 10 MG/ML IJ SOLN
INTRAMUSCULAR | Status: DC | PRN
  Administered 2021-08-12: 10 mg via INTRAVENOUS

## 2021-08-12 MED ORDER — CHLORHEXIDINE GLUCONATE 0.12 % MT SOLN: 15.0000 mL | Freq: Once | OROMUCOSAL | Status: AC

## 2021-08-12 MED ORDER — ACETAMINOPHEN 500 MG PO TABS: 1000.0000 mg | ORAL_TABLET | ORAL | Status: AC

## 2021-08-12 MED ORDER — ORAL CARE MOUTH RINSE: 15.0000 mL | Freq: Once | OROMUCOSAL | Status: AC

## 2021-08-12 MED ORDER — FENTANYL CITRATE (PF) 100 MCG/2ML IJ SOLN
INTRAMUSCULAR | Status: DC | PRN
  Administered 2021-08-12 (×2): 50 ug via INTRAVENOUS

## 2021-08-12 MED ORDER — PROPOFOL 10 MG/ML IV BOLUS
INTRAVENOUS | Status: AC
  Filled 2021-08-12: qty 20

## 2021-08-12 MED ORDER — FAMOTIDINE 20 MG PO TABS: 20.0000 mg | ORAL_TABLET | Freq: Once | ORAL | Status: AC

## 2021-08-12 MED ORDER — GELATIN ABSORBABLE 100 CM EX MISC
CUTANEOUS | Status: DC | PRN
  Administered 2021-08-12: 1

## 2021-08-12 MED ORDER — MIDAZOLAM HCL 2 MG/2ML IJ SOLN
INTRAMUSCULAR | Status: DC | PRN
  Administered 2021-08-12: 2 mg via INTRAVENOUS

## 2021-08-12 MED ORDER — FENTANYL CITRATE (PF) 100 MCG/2ML IJ SOLN
INTRAMUSCULAR | Status: AC
  Filled 2021-08-12: qty 2

## 2021-08-12 MED ORDER — PROPOFOL 500 MG/50ML IV EMUL
INTRAVENOUS | Status: AC
  Filled 2021-08-12: qty 50

## 2021-08-12 MED ORDER — CELECOXIB 200 MG PO CAPS
200.0000 mg | ORAL_CAPSULE | ORAL | Status: AC
  Administered 2021-08-12: 200 mg via ORAL

## 2021-08-12 MED ORDER — HYDROCODONE-ACETAMINOPHEN 5-325 MG PO TABS: 1.0000 | ORAL_TABLET | Freq: Four times a day (QID) | ORAL | 0 refills | Status: DC | PRN

## 2021-08-12 MED ORDER — LIDOCAINE HCL (PF) 2 % IJ SOLN
INTRAMUSCULAR | Status: AC
  Filled 2021-08-12: qty 5

## 2021-08-12 MED ORDER — DEXAMETHASONE SODIUM PHOSPHATE 10 MG/ML IJ SOLN
INTRAMUSCULAR | Status: AC
  Filled 2021-08-12: qty 1

## 2021-08-12 MED ORDER — PROPOFOL 10 MG/ML IV BOLUS
INTRAVENOUS | Status: DC | PRN
  Administered 2021-08-12: 10 mg via INTRAVENOUS
  Administered 2021-08-12: 30 mg via INTRAVENOUS
  Administered 2021-08-12: 100 mg via INTRAVENOUS
  Administered 2021-08-12: 10 mg via INTRAVENOUS

## 2021-08-12 MED ORDER — GABAPENTIN 300 MG PO CAPS
ORAL_CAPSULE | ORAL | Status: AC
  Administered 2021-08-12: 300 mg via ORAL
  Filled 2021-08-12: qty 1

## 2021-08-12 MED ORDER — GELATIN ABSORBABLE 100 CM EX MISC
CUTANEOUS | Status: AC
  Filled 2021-08-12: qty 1

## 2021-08-12 SURGICAL SUPPLY — 29 items
ADAPTER SMOKE EVAC 7/8 TO 3/8 (ADAPTER) ×2 IMPLANT
BLADE SURG 15 STRL LF DISP TIS (BLADE) ×1 IMPLANT
BLADE SURG 15 STRL SS (BLADE) ×2
BRIEF STRETCH FOR OB PAD XXL (UNDERPADS AND DIAPERS) ×2 IMPLANT
DRAPE LAPAROTOMY 100X77 ABD (DRAPES) ×2 IMPLANT
DRAPE LEGGINS SURG 28X43 STRL (DRAPES) ×2 IMPLANT
DRAPE UNDER BUTTOCK W/FLU (DRAPES) ×2 IMPLANT
DRSG GAUZE FLUFF 36X18 (GAUZE/BANDAGES/DRESSINGS) ×2 IMPLANT
ELECT CAUTERY BLADE TIP 2.5 (TIP) ×2
ELECT REM PT RETURN 9FT ADLT (ELECTROSURGICAL) ×2
ELECTRODE CAUTERY BLDE TIP 2.5 (TIP) ×1 IMPLANT
ELECTRODE REM PT RTRN 9FT ADLT (ELECTROSURGICAL) ×1 IMPLANT
GAUZE 4X4 16PLY ~~LOC~~+RFID DBL (SPONGE) ×2 IMPLANT
GLOVE SURG ORTHO LTX SZ7.5 (GLOVE) ×2 IMPLANT
GOWN STRL REUS W/ TWL LRG LVL3 (GOWN DISPOSABLE) ×2 IMPLANT
GOWN STRL REUS W/TWL LRG LVL3 (GOWN DISPOSABLE) ×4
KIT TURNOVER KIT A (KITS) ×2 IMPLANT
MANIFOLD NEPTUNE II (INSTRUMENTS) ×2 IMPLANT
NDL SAFETY ECLIPSE 18X1.5 (NEEDLE) ×1 IMPLANT
NEEDLE HYPO 18GX1.5 SHARP (NEEDLE) ×2
NEEDLE HYPO 22GX1.5 SAFETY (NEEDLE) ×2 IMPLANT
NS IRRIG 500ML POUR BTL (IV SOLUTION) ×2 IMPLANT
PACK BASIN MINOR ARMC (MISCELLANEOUS) ×2 IMPLANT
SOL PREP PVP 2OZ (MISCELLANEOUS) ×2
SOLUTION PREP PVP 2OZ (MISCELLANEOUS) ×1 IMPLANT
SURGILUBE 2OZ TUBE FLIPTOP (MISCELLANEOUS) ×2 IMPLANT
SYR 10ML LL (SYRINGE) ×2 IMPLANT
TUBING SMOKE EVAC 6FT (TUBING) ×2 IMPLANT
WATER STERILE IRR 500ML POUR (IV SOLUTION) ×2 IMPLANT

## 2021-08-12 NOTE — Transfer of Care (Signed)
Immediate Anesthesia Transfer of Care Note  Patient: Amy Cisneros  Procedure(s) Performed: CONDYLOMA REMOVAL (Right)  Patient Location: PACU  Anesthesia Type:General  Level of Consciousness: awake, alert  and oriented  Airway & Oxygen Therapy: Patient Spontanous Breathing and Patient connected to face mask oxygen  Post-op Assessment: Report given to RN and Post -op Vital signs reviewed and stable  Post vital signs: Reviewed and stable  Last Vitals:  Vitals Value Taken Time  BP 126/65 08/12/21 1700  Temp    Pulse 75 08/12/21 1702  Resp 10 08/12/21 1702  SpO2 100 % 08/12/21 1702  Vitals shown include unvalidated device data.  Last Pain:  Vitals:   08/12/21 1458  TempSrc: Temporal  PainSc: 0-No pain         Complications: No notable events documented.

## 2021-08-12 NOTE — Discharge Instructions (Signed)

## 2021-08-12 NOTE — Op Note (Signed)
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  Pre-operative Diagnosis: Perineal condyloma acuminata  Post-operative Diagnosis: same.    Surgeon: Ronny Bacon, M.D., [facility]  Anesthesia: General endotracheal  Findings: As noted on exam roughly a 25 cm area by a 9 cm area on the patient's right anterior perianal/perineal area, these are actually more genital than perianal.  Estimated Blood Loss: 15 mL         Specimens: 2 plaques of confluent condyloma acuminata.          Complications: none              Procedure Details  The patient was seen again in the Holding Room. The benefits, complications, treatment options, and expected outcomes were discussed with the patient. The risks of bleeding, infection, recurrence of symptoms, failure to resolve symptoms, unanticipated injury, prosthetic placement, prosthetic infection, any of which could require further surgery were reviewed with the patient. The likelihood of improving the patient's symptoms with return to their baseline status is expected.  The patient and/or family concurred with the proposed plan, giving informed consent.  The patient was taken to Operating Room, identified and the procedure verified.    Prior to the induction of general anesthesia, antibiotic prophylaxis was administered. VTE prophylaxis was in place.  Monitored anesthesia care was then administered and tolerated well. After the induction, the patient was positioned in the lithotomy position and the perineum was prepped with Betadine and draped in the sterile fashion.  A Time Out was held and the above information confirmed.  I attempted infiltration of local anesthesia however the patient did not tolerate this under heavy sedation.  She was then converted to general endotracheal anesthesia.  We then proceeded with infiltration of the dermis and subcutaneous tissues of the region.  We then proceeded from the posterior aspect toward the  cephalad aspect of grasping the carpet of condyloma with Babcock and shaving with a 10 blade from the underlying deep dermis and subcutaneous tissues as needed.  This was done for the 9 cm area and eventually the 25 cm area after obtaining hemostasis with judicious electrocautery.  This resulted in total excision of the genital warts, couple questionable areas were desiccated with cautery. Pressure applied to confirm hemostasis, this was replaced with a Gelfoam.  Fluff gauze and ABD pad secured with mesh briefs.  She was subsequently extubated brought to recovery room stable condition.  He appeared to tolerate procedure well.      Ronny Bacon M.D., [facility] 08/12/2021 4:59 PM

## 2021-08-12 NOTE — Anesthesia Procedure Notes (Signed)
Procedure Name: MAC Date/Time: 08/12/2021 4:27 PM Performed by: Demetrius Charity, CRNA Pre-anesthesia Checklist: Patient identified, Patient being monitored, Timeout performed, Emergency Drugs available and Suction available Patient Re-evaluated:Patient Re-evaluated prior to induction Oxygen Delivery Method: Simple face mask Preoxygenation: Pre-oxygenation with 100% oxygen Ventilation: Mask ventilation without difficulty and Oral airway inserted - appropriate to patient size Dental Injury: Teeth and Oropharynx as per pre-operative assessment

## 2021-08-12 NOTE — Interval H&P Note (Signed)
History and Physical Interval Note:  08/12/2021 4:03 PM  Amy Cisneros  has presented today for surgery, with the diagnosis of anal condyloma, cpt code 62694.  The various methods of treatment have been discussed with the patient and family. After consideration of risks, benefits and other options for treatment, the patient has consented to  Procedure(s): CONDYLOMA REMOVAL (Right) as a surgical intervention.  The patient's history has been reviewed, patient examined, no change in status, stable for surgery.  I have reviewed the patient's chart and labs.  Questions were answered to the patient's satisfaction.     Campbell Lerner

## 2021-08-12 NOTE — Anesthesia Postprocedure Evaluation (Signed)
Anesthesia Post Note  Patient: Amy Cisneros  Procedure(s) Performed: CONDYLOMA REMOVAL (Right)  Patient location during evaluation: PACU Anesthesia Type: General Level of consciousness: awake and alert, oriented and patient cooperative Pain management: pain level controlled Vital Signs Assessment: post-procedure vital signs reviewed and stable Respiratory status: spontaneous breathing, nonlabored ventilation and respiratory function stable Cardiovascular status: blood pressure returned to baseline and stable Postop Assessment: adequate PO intake Anesthetic complications: no   No notable events documented.   Last Vitals:  Vitals:   08/12/21 1715 08/12/21 1730  BP: 116/72 131/70  Pulse: 75 70  Resp: (!) 21 17  Temp:    SpO2: 100% 99%    Last Pain:  Vitals:   08/12/21 1730  TempSrc:   PainSc: 0-No pain                 Reed Breech

## 2021-08-12 NOTE — Anesthesia Preprocedure Evaluation (Signed)
Anesthesia Evaluation  Patient identified by MRN, date of birth, ID band Patient awake    Reviewed: Allergy & Precautions, NPO status , Patient's Chart, lab work & pertinent test results  History of Anesthesia Complications Negative for: history of anesthetic complications  Airway Mallampati: III   Neck ROM: Full    Dental   Missing few molars:   Pulmonary sleep apnea and Continuous Positive Airway Pressure Ventilation , COPD, Current Smoker (1/2 ppd)Patient did not abstain from smoking.,    Pulmonary exam normal breath sounds clear to auscultation       Cardiovascular Normal cardiovascular exam Rhythm:Regular Rate:Normal  ECG 08/10/21: normal   Neuro/Psych  Neuromuscular disease (neuropathy)    GI/Hepatic negative GI ROS, Fatty liver   Endo/Other  diabetes, Type 2Class 3 obesity  Renal/GU negative Renal ROS     Musculoskeletal   Abdominal   Peds  Hematology  (+) Blood dyscrasia, anemia ,   Anesthesia Other Findings   Reproductive/Obstetrics                             Anesthesia Physical Anesthesia Plan  ASA: 3  Anesthesia Plan: General   Post-op Pain Management:    Induction: Intravenous  PONV Risk Score and Plan: 2 and Propofol infusion, TIVA, Treatment may vary due to age or medical condition and Ondansetron  Airway Management Planned: Natural Airway  Additional Equipment:   Intra-op Plan:   Post-operative Plan:   Informed Consent: I have reviewed the patients History and Physical, chart, labs and discussed the procedure including the risks, benefits and alternatives for the proposed anesthesia with the patient or authorized representative who has indicated his/her understanding and acceptance.       Plan Discussed with: CRNA  Anesthesia Plan Comments: (LMA/GETA backup discussed.  Patient consented for risks of anesthesia including but not limited to:  - adverse  reactions to medications - damage to eyes, teeth, lips or other oral mucosa - nerve damage due to positioning  - sore throat or hoarseness - damage to heart, brain, nerves, lungs, other parts of body or loss of life  Informed patient about role of CRNA in peri- and intra-operative care.  Patient voiced understanding.)        Anesthesia Quick Evaluation

## 2021-08-12 NOTE — Anesthesia Procedure Notes (Signed)
Procedure Name: Intubation Date/Time: 08/12/2021 4:34 PM Performed by: Demetrius Charity, CRNA Pre-anesthesia Checklist: Patient identified, Patient being monitored, Timeout performed, Emergency Drugs available and Suction available Patient Re-evaluated:Patient Re-evaluated prior to induction Oxygen Delivery Method: Circle system utilized Preoxygenation: Pre-oxygenation with 100% oxygen Induction Type: IV induction and Rapid sequence Ventilation: Mask ventilation without difficulty Laryngoscope Size: 3 and McGraph Grade View: Grade I Tube type: Oral Tube size: 6.5 mm Number of attempts: 1 Airway Equipment and Method: Stylet and Video-laryngoscopy Placement Confirmation: ETT inserted through vocal cords under direct vision, positive ETCO2 and breath sounds checked- equal and bilateral Secured at: 21 cm Tube secured with: Tape Dental Injury: Teeth and Oropharynx as per pre-operative assessment  Difficulty Due To: Difficulty was anticipated

## 2021-08-13 ENCOUNTER — Encounter: Payer: Self-pay | Admitting: Surgery

## 2021-08-14 LAB — SURGICAL PATHOLOGY

## 2021-08-17 ENCOUNTER — Telehealth: Payer: Self-pay

## 2021-08-17 NOTE — Telephone Encounter (Signed)
Spoke w/patient. She had surgery for condyloma in genital area last week done by General Surgery. It's been moist down there. It's itching bad. She's afraid to use cream d/t healing of surgical site. She did not contact surgeon for meds as Helmut Muster has rx'd before.

## 2021-08-17 NOTE — Telephone Encounter (Signed)
Pt needs to f/u with gen surgeon as itching could be healing and not yeast vag.

## 2021-08-17 NOTE — Telephone Encounter (Signed)
Patient reports she has a yeast infection. Requesting rx for Diflucan. [id number]

## 2021-08-18 ENCOUNTER — Other Ambulatory Visit: Payer: Self-pay | Admitting: Obstetrics and Gynecology

## 2021-08-18 MED ORDER — FLUCONAZOLE 150 MG PO TABS: 150.0000 mg | ORAL_TABLET | Freq: Once | ORAL | 0 refills | Status: AC

## 2021-08-18 NOTE — Progress Notes (Signed)
Rx diflucan for yeast vag sx again

## 2021-08-18 NOTE — Telephone Encounter (Signed)
Pt calling to see if rx for yeast inf sent.  [phone]

## 2021-08-18 NOTE — Telephone Encounter (Signed)
Patient aware. She reports the surgical site is on the sides. She is itching inside the vagina admits to some d/c, but not cottage cheese type.

## 2021-08-18 NOTE — Telephone Encounter (Signed)
Rx eRxd.  

## 2021-08-19 ENCOUNTER — Encounter: Payer: Self-pay | Admitting: Surgery

## 2021-08-19 NOTE — Telephone Encounter (Signed)
Patient aware. She received a notification from the pharmacy earlier today.

## 2021-08-27 ENCOUNTER — Encounter: Payer: Medicaid Other | Admitting: Surgery

## 2021-09-01 ENCOUNTER — Encounter: Payer: Self-pay | Admitting: Obstetrics and Gynecology

## 2021-09-01 ENCOUNTER — Encounter: Payer: Self-pay | Admitting: Surgery

## 2021-09-01 ENCOUNTER — Ambulatory Visit (INDEPENDENT_AMBULATORY_CARE_PROVIDER_SITE_OTHER): Payer: Medicaid Other | Admitting: Surgery

## 2021-09-01 ENCOUNTER — Other Ambulatory Visit: Payer: Self-pay

## 2021-09-01 VITALS — BP 165/98 | HR 96 | Temp 98.6°F | Ht 66.0 in | Wt 383.0 lb

## 2021-09-01 DIAGNOSIS — A630 Anogenital (venereal) warts: Secondary | ICD-10-CM

## 2021-09-01 NOTE — Progress Notes (Signed)
[facility] POST-OP OFFICE VISIT  09/01/2021  HPI: Amy Cisneros is a 46 y.o. female 20 days s/p excision of peri-anal genital warts.  She had had some issues with dressing sticking and bleeding etc. but she has found a happy medium of how to care for her wound.  She reports having a recent yeast infection and got Diflucan orally for it.  Overall she appears to be handling her wound well and appears to be making great progress.  Vital signs: BP (!) 165/98    Pulse 96    Temp 98.6 F (37 C) (Oral)    Ht '5\' 6"'  (1.676 m)    Wt (!) 383 lb (173.7 kg)    SpO2 98%    BMI 61.82 kg/m    Physical Exam: Constitutional: She appears well.  Skin: Her right sided perianal skin has 2 residual areas that are well granulated without hypertrophic granulation.  And the adjacent skin appears to be covering the wound nicely.  We reviewed her pathology and confirm there is nothing of suspicion or extra concern aside from the warts present there.  Assessment/Plan: This is a 46 y.o. 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  Progressing well, with near complete healing.  Patient Active Problem List   Diagnosis Date Noted   Genital warts 03/23/2018   BRCA negative 04/25/2017   Chronic lower extremity pain (Secondary Area of Pain) (Bilateral) (R>L) 02/09/2017   Chronic knee pain [facility] Area of Pain) (Bilateral) (R>L) 02/09/2017   Chronic hip pain (Fourth Area of Pain) (Bilateral) (R>L) 02/09/2017   Chronic shoulder pain (Fifth Area of Pain) (Bilateral) (R>L) 02/09/2017   Chronic neck pain (Bilateral) (R>L) 02/09/2017   NSAID long-term use 02/09/2017   Osteoarthritis of knee 02/08/2017   Chronic pain syndrome 02/08/2017   Opiate use 02/08/2017    Class: History of   Lumbar paracentral disc bulge at L2-3 (Left) 02/08/2017   Lumbar facet hypertrophy (Bilateral) 02/08/2017   Chronic lumbar radiculitis of L3 (Left) 02/08/2017   Chronic low back pain (Primary  Area of Pain) (Bilateral) (L>R) 02/08/2017   Morbid obesity with BMI of 50.0-59.9, adult ([place]) 02/08/2017   Family history of breast cancer 02/01/2017   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) DNA test positive 02/01/2017   Family history of BRCA gene positive 02/01/2017   Dyspepsia 03/26/2014   IBS (irritable bowel syndrome) 03/26/2014    -As we anticipate completion of her healing without issue over the next few weeks.  I believe she could follow-up with [place] in a month or 2 if for some reason she fails to complete healing. Otherwise we would have her on a callback to reassess these areas annually to assess for any recurrence of the warts.   Ronny Bacon M.D., FACS 09/01/2021, 4:14 PM

## 2021-09-01 NOTE — Patient Instructions (Addendum)
Please call our office with any questions or concerns.  We will reach out to you February 2024 to schedule a follow up examination.

## 2022-04-14 ENCOUNTER — Other Ambulatory Visit: Payer: Self-pay

## 2022-04-14 ENCOUNTER — Emergency Department
Admission: EM | Admit: 2022-04-14 | Discharge: 2022-04-14 | Disposition: A | Discharge status: Home / Self Care | Payer: Medicaid Other | Attending: Emergency Medicine | Admitting: Emergency Medicine

## 2022-04-14 DIAGNOSIS — R197 Diarrhea, unspecified: Secondary | ICD-10-CM | POA: Diagnosis not present

## 2022-04-14 DIAGNOSIS — E1165 Type 2 diabetes mellitus with hyperglycemia: Secondary | ICD-10-CM | POA: Insufficient documentation

## 2022-04-14 DIAGNOSIS — R739 Hyperglycemia, unspecified: Secondary | ICD-10-CM | POA: Diagnosis present

## 2022-04-14 DIAGNOSIS — J449 Chronic obstructive pulmonary disease, unspecified: Secondary | ICD-10-CM | POA: Insufficient documentation

## 2022-04-14 DIAGNOSIS — D72829 Elevated white blood cell count, unspecified: Secondary | ICD-10-CM | POA: Insufficient documentation

## 2022-04-14 LAB — CBC
HCT: 39.4 % (ref 36.0–46.0)
Hemoglobin: 12.6 g/dL (ref 12.0–15.0)
MCH: 26.7 pg (ref 26.0–34.0)
MCHC: 32 g/dL (ref 30.0–36.0)
MCV: 83.5 fL (ref 80.0–100.0)
Platelets: 475 10*3/uL — ABNORMAL HIGH (ref 150–400)
RBC: 4.72 MIL/uL (ref 3.87–5.11)
RDW: 14.6 % (ref 11.5–15.5)
WBC: 15.3 10*3/uL — ABNORMAL HIGH (ref 4.0–10.5)
nRBC: 0 % (ref 0.0–0.2)

## 2022-04-14 LAB — URINALYSIS, ROUTINE W REFLEX MICROSCOPIC
Bacteria, UA: NONE SEEN
Bilirubin Urine: NEGATIVE
Glucose, UA: >=500 mg/dL — AB
Hgb urine dipstick: NEGATIVE
Ketones, ur: 5 mg/dL — AB
Leukocytes,Ua: NEGATIVE
Nitrite: NEGATIVE
Protein, ur: NEGATIVE mg/dL
Specific Gravity, Urine: 1.037 — ABNORMAL HIGH (ref 1.005–1.030)
pH: 5 (ref 5.0–8.0)

## 2022-04-14 LAB — BASIC METABOLIC PANEL
Anion gap: 11 (ref 5–15)
BUN: 12 mg/dL (ref 6–20)
CO2: 24 mmol/L (ref 22–32)
Calcium: 9.1 mg/dL (ref 8.9–10.3)
Chloride: 95 mmol/L — ABNORMAL LOW (ref 98–111)
Creatinine, Ser: 1.11 mg/dL — ABNORMAL HIGH (ref 0.44–1.00)
GFR, Estimated: >60 mL/min (ref >60)
Glucose, Bld: 506 mg/dL (ref 70–99)
Potassium: 4.3 mmol/L (ref 3.5–5.1)
Sodium: 130 mmol/L — ABNORMAL LOW (ref 135–145)

## 2022-04-14 LAB — CBG MONITORING, ED
Glucose-Capillary: 464 mg/dL — ABNORMAL HIGH (ref 70–99)
Glucose-Capillary: 381 mg/dL — ABNORMAL HIGH (ref 70–99)

## 2022-04-14 MED ORDER — SODIUM CHLORIDE 0.9 % IV BOLUS
1000.0000 mL | Freq: Once | INTRAVENOUS | Status: AC
Start: 2022-04-14 — End: 2022-04-14
  Administered 2022-04-14: 1000 mL via INTRAVENOUS

## 2022-04-14 MED ORDER — LACTATED RINGERS IV BOLUS
1000.0000 mL | Freq: Once | INTRAVENOUS | Status: AC
  Administered 2022-04-14: 1000 mL via INTRAVENOUS

## 2022-04-14 MED ORDER — TRULICITY 1.5 MG/0.5ML ~~LOC~~ SOAJ: 1.5000 mg | SUBCUTANEOUS | 0 refills | Status: AC

## 2022-04-14 MED ORDER — INSULIN ASPART 100 UNIT/ML IJ SOLN
10.0000 [IU] | Freq: Once | INTRAMUSCULAR | Status: AC
  Administered 2022-04-14: 10 [IU] via SUBCUTANEOUS
  Filled 2022-04-14: qty 1

## 2022-04-14 MED ORDER — ONDANSETRON HCL 4 MG/2ML IJ SOLN
4.0000 mg | Freq: Once | INTRAMUSCULAR | Status: AC
  Administered 2022-04-14: 4 mg via INTRAVENOUS
  Filled 2022-04-14: qty 2

## 2022-04-14 MED ORDER — METFORMIN HCL 1000 MG PO TABS: 1000.0000 mg | ORAL_TABLET | Freq: Two times a day (BID) | ORAL | 0 refills | Status: DC

## 2022-04-14 NOTE — ED Notes (Signed)
Patient discharged to home per MD order. Patient in stable condition, and deemed medically cleared by ED provider for discharge. Discharge instructions reviewed with patient/family using "Teach Back"; verbalized understanding of medication education and administration, and information about follow-up care. Denies further concerns. ° °

## 2022-04-14 NOTE — ED Triage Notes (Signed)
Pt to ED for high BG, has been out of metformin for 2 months, didn't have gas money to see PCP. A friend gave her some of her metformin, she took 3 pills this AM after checking BG that was 400.  She checked BG 3hrs later and was 547, then 579 at PCP, who told her "just go to the hospital".   Pt complains of excessive thirst, polyuria, diarrhea, nausea with several emesis episodes since 2 weeks. Takes suboxone for pain.  CBG is 464.

## 2022-04-14 NOTE — ED Provider Notes (Signed)
[facility] Provider Note    Event Date/Time   First MD Initiated Contact with Patient 04/14/22 1526     (approximate)   History   Hyperglycemia   HPI  Amy Cisneros is a 45 y.o. female pmh DM who presents with hyperglycemia. Patient notes that she has been out of her DM medications for several months. Usually takes trulicity and metformin. She saw her PCP today and BG was elevated so she was told to come to the hospital. Patient has had fatigued, lightheadedness and for 2 weeks has had N/V and diarrhea. Diarrhea is nonbloody about 5-6 episodes per day. Had vomited 6 times this week. Denies abdominal pain. Denies fever or chills. Has had frequent urination and some burning. Also very thirsty.       Past Medical History:  Diagnosis Date   Anemia    BRCA negative 02/2017   MyRisk neg; IBIS=18% per my calculation; BRCA gene pos in family, pt is neg.   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) DNA test positive    COPD (chronic obstructive pulmonary disease) (HCC)    Diabetes mellitus without complication (HCC)    Family history of BRCA gene mutation    mat side   Family history of breast cancer    Family history of ovarian cancer    Fatty liver    Genital warts    Hidradenitis suppurativa    LGSIL on Pap smear of cervix 2014   Neuropathy    Obesity    Sleep apnea     Patient Active Problem List   Diagnosis Date Noted   Genital warts 03/23/2018   BRCA negative 04/25/2017   Chronic lower extremity pain (Secondary Area of Pain) (Bilateral) (R>L) 02/09/2017   Chronic knee pain (Tertiary Area of Pain) (Bilateral) (R>L) 02/09/2017   Chronic hip pain (Fourth Area of Pain) (Bilateral) (R>L) 02/09/2017   Chronic shoulder pain (Fifth Area of Pain) (Bilateral) (R>L) 02/09/2017   Chronic neck pain (Bilateral) (R>L) 02/09/2017   NSAID long-term use 02/09/2017   Osteoarthritis of knee 02/08/2017   Chronic pain syndrome 02/08/2017   Opiate use  02/08/2017    Class: History of   Lumbar paracentral disc bulge at L2-3 (Left) 02/08/2017   Lumbar facet hypertrophy (Bilateral) 02/08/2017   Chronic lumbar radiculitis of L3 (Left) 02/08/2017   Chronic low back pain (Primary Area of Pain) (Bilateral) (L>R) 02/08/2017   Morbid obesity with BMI of 50.0-59.9, adult ([place]) 02/08/2017   Family history of breast cancer 02/01/2017   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) DNA test positive 02/01/2017   Family history of BRCA gene positive 02/01/2017   Dyspepsia 03/26/2014   IBS (irritable bowel syndrome) 03/26/2014     Physical Exam  Triage Vital Signs: ED Triage Vitals  Enc Vitals Group     BP 04/14/22 1405 121/87     Pulse Rate 04/14/22 1405 95     Resp 04/14/22 1405 18     Temp 04/14/22 1405 97.9 F (36.6 C)     Temp Source 04/14/22 1405 Oral     SpO2 04/14/22 1405 95 %     Weight 04/14/22 1359 (!) 362 lb (164.2 kg)     Height 04/14/22 1359 '5\' 6"'  (1.676 m)     Head Circumference --      Peak Flow --      Pain Score 04/14/22 1359 7     Pain Loc --      Pain Edu? --  Excl. in [place]? --     Most recent vital signs: Vitals:   04/14/22 1405  BP: 121/87  Pulse: 95  Resp: 18  Temp: 97.9 F (36.6 C)  SpO2: 95%     General: Awake, no distress.  CV:  Good peripheral perfusion.  Resp:  Normal effort.  Abd:  No distention. Obese, nontender Neuro:             Awake, Alert, Oriented x 3  Other:  Dry MM   ED Results / Procedures / Treatments  Labs (all labs ordered are listed, but only abnormal results are displayed) Labs Reviewed  BASIC METABOLIC PANEL - Abnormal; Notable for the following components:      Result Value   Sodium 130 (*)    Chloride 95 (*)    Glucose, Bld 506 (*)    Creatinine, Ser 1.11 (*)    All other components within normal limits  CBC - Abnormal; Notable for the following components:   WBC 15.3 (*)    Platelets 475 (*)    All other components within normal limits  URINALYSIS, ROUTINE W  REFLEX MICROSCOPIC - Abnormal; Notable for the following components:   Color, Urine YELLOW (*)    APPearance CLEAR (*)    Specific Gravity, Urine 1.037 (*)    Glucose, UA >=500 (*)    Ketones, ur 5 (*)    All other components within normal limits  CBG MONITORING, ED - Abnormal; Notable for the following components:   Glucose-Capillary 464 (*)    All other components within normal limits  CBG MONITORING, ED - Abnormal; Notable for the following components:   Glucose-Capillary 381 (*)    All other components within normal limits  POC URINE PREG, ED     EKG     RADIOLOGY    PROCEDURES:  Critical Care performed: No  Procedures    MEDICATIONS ORDERED IN ED: Medications  sodium chloride 0.9 % bolus 1,000 mL (0 mLs Intravenous Stopped 04/14/22 1703)  ondansetron (ZOFRAN) injection 4 mg (4 mg Intravenous Given 04/14/22 1417)  lactated ringers bolus 1,000 mL (1,000 mLs Intravenous New Bag/Given 04/14/22 1703)  insulin aspart (novoLOG) injection 10 Units (10 Units Subcutaneous Given 04/14/22 1556)     IMPRESSION / MDM / ASSESSMENT AND PLAN / ED COURSE  I reviewed the triage vital signs and the nursing notes.                              Patient's presentation is most consistent with acute presentation with potential threat to life or bodily function.  Differential diagnosis includes, but is not limited to, DKA, hyperkalemia, AKI, electrolyte abnormality, enteritis, gastroparesis  Patient is a 46 year old female who is type II diabetic on metformin and Trulicity who presents with elevated blood sugar.  She has not had her medication several months does not routinely check her blood sugar at home because she ran out of strips.  Checked her blood sugar recently was in the 400s and then shot up to the 600s.  She saw her primary doctor was elevated in the office and she was told to come to the ER.  She has had 2 weeks of nausea vomiting and diarrhea, also had polyuria polydipsia  and some burning with urination.  She denies any abdominal pain fever shortness of breath or chest pain.  Blood sugar is 506 but there is no anion gap and normal bicarb presentation is not  consistent with DKA.  CBC showing leukocytosis could be reactive.  Her abdomen is benign I have low suspicion for acute abdominal process such as appendicitis.  Suspect that either hypoglycemia is driving her GI symptoms versus she has a viral illness that is contributing to her hyperglycemia.  Patient was given 2 L of fluids and subcu insulin blood sugar improved to 380.  I have refilled her metformin and Trulicity but recommended she follow-up with her PCP as she is likely going to need additional medications for her blood sugar.     FINAL CLINICAL IMPRESSION(S) / ED DIAGNOSES   Final diagnoses:  Hyperglycemia     Rx / DC Orders   ED Discharge Orders          Ordered    Dulaglutide (TRULICITY) 1.5 LF/8.1OF SOPN  Every Sat        04/14/22 1721    metFORMIN (GLUCOPHAGE) 1000 MG tablet  2 times daily with meals        04/14/22 1721             Note:  This document was prepared using Dragon voice recognition software and may include unintentional dictation errors.   Rada Hay, MD 04/14/22 [phone]

## 2022-04-14 NOTE — Discharge Instructions (Addendum)
I have sent a refill of your prescriptions of Trulicity and metformin to your pharmacy.  Please try to limit your carbohydrate intake.  Please make sure you are staying hydrated.  Please follow-up with your primary care doctor for further management of your diabetes.

## 2022-04-14 NOTE — ED Provider Triage Note (Signed)
Emergency Medicine Provider Triage Evaluation Note  Amy Cisneros , a 46 y.o. female  was evaluated in triage.  Pt complains of high blood sugar. Has been out of her medication for "a while" and took a friends metformin.  Her doctor told her to come to ED.   Review of Systems  Positive: diabetes Negative: No N/V/D  Physical Exam  Ht 5\' 6"  (1.676 m)   Wt (!) 164.2 kg   BMI 58.43 kg/m  Gen:   Awake, no distress   Resp:  Normal effort  MSK:   Moves extremities without difficulty  Other:    Medical Decision Making  Medically screening exam initiated at 2:04 PM.  Appropriate orders placed.  Amy Cisneros was informed that the remainder of the evaluation will be completed by another provider, this initial triage assessment does not replace that evaluation, and the importance of remaining in the ED until their evaluation is complete.     Amy Hai, PA-C 04/14/22 1426

## 2022-09-02 ENCOUNTER — Ambulatory Visit: Payer: Medicaid Other | Admitting: Surgery

## 2022-09-09 ENCOUNTER — Ambulatory Visit: Payer: Medicaid Other | Admitting: Surgery

## 2022-09-14 ENCOUNTER — Ambulatory Visit: Payer: Medicaid Other | Admitting: Surgery

## 2022-10-14 ENCOUNTER — Emergency Department
Admission: EM | Admit: 2022-10-14 | Discharge: 2022-10-14 | Disposition: A | Discharge status: Home / Self Care | Payer: Medicaid Other | Attending: Emergency Medicine | Admitting: Emergency Medicine

## 2022-10-14 ENCOUNTER — Encounter: Payer: Self-pay | Admitting: Emergency Medicine

## 2022-10-14 ENCOUNTER — Other Ambulatory Visit: Payer: Self-pay

## 2022-10-14 DIAGNOSIS — L02212 Cutaneous abscess of back [any part, except buttock]: Secondary | ICD-10-CM | POA: Insufficient documentation

## 2022-10-14 DIAGNOSIS — L0291 Cutaneous abscess, unspecified: Secondary | ICD-10-CM

## 2022-10-14 MED ORDER — SULFAMETHOXAZOLE-TRIMETHOPRIM 800-160 MG PO TABS: 1.0000 | ORAL_TABLET | Freq: Two times a day (BID) | ORAL | 0 refills | Status: AC

## 2022-10-14 MED ORDER — KETOROLAC TROMETHAMINE 30 MG/ML IJ SOLN
30.0000 mg | Freq: Once | INTRAMUSCULAR | Status: AC
Start: 2022-10-14 — End: 2022-10-14
  Administered 2022-10-14: 30 mg via INTRAMUSCULAR
  Filled 2022-10-14: qty 1

## 2022-10-14 MED ORDER — SULFAMETHOXAZOLE-TRIMETHOPRIM 800-160 MG PO TABS
1.0000 | ORAL_TABLET | Freq: Once | ORAL | Status: AC
  Administered 2022-10-14: 1 via ORAL
  Filled 2022-10-14: qty 1

## 2022-10-14 MED ORDER — LIDOCAINE HCL (PF) 1 % IJ SOLN
15.0000 mL | Freq: Once | INTRAMUSCULAR | Status: AC
  Administered 2022-10-14: 15 mL
  Filled 2022-10-14: qty 15

## 2022-10-14 NOTE — ED Provider Notes (Signed)
[facility] Provider Note    Event Date/Time   First MD Initiated Contact with Patient 10/14/22 [phone]     (approximate)   History   Abscess   HPI  Amy Cisneros is a 47 y.o. female who presents to the ED for evaluation of Abscess   Morbidly obese woman with BMI of 59 presents to the ED with an abscess on her back.  Reports she has had multiple of these in the past and when reviewing the chart she has had MRSA positive wound cultures from various abscesses.  No fevers or systemic symptoms.  Reports pain.  Reports having a dermatology clinic visit already scheduled for next week or the week after, she is not certain the exact date later in April  Physical Exam   Triage Vital Signs: ED Triage Vitals  Enc Vitals Group     BP 10/14/22 0617 (!) 161/73     Pulse Rate 10/14/22 0617 94     Resp 10/14/22 0617 18     Temp --      Temp src --      SpO2 10/14/22 0617 100 %     Weight 10/14/22 0612 (!) 365 lb (165.6 kg)     Height 10/14/22 0612 5\' 6"  (1.676 m)     Head Circumference --      Peak Flow --      Pain Score 10/14/22 0612 8     Pain Loc --      Pain Edu? --      Excl. in [place]? --     Most recent vital signs: Vitals:   10/14/22 0617  BP: (!) 161/73  Pulse: 94  Resp: 18  SpO2: 100%    General: Awake, no distress.  Morbidly obese.  Looks systemically well.  Making jokes, pleasant and conversational CV:  Good peripheral perfusion.  Resp:  Normal effort.  Abd:  No distention.  MSK:  No deformity noted.  Neuro:  No focal deficits appreciated. Other:  To the pannus of her right-sided paraspinal thoracolumbar back she has an abscess coming to a head.  Minimal small-volume purulence is noted coming from the central area. I can palpate and appreciate induration and fluctuance that seems to measure about 3 x 6 cm   ED Results / Procedures / Treatments   Labs (all labs ordered are listed, but only abnormal results are displayed) Labs Reviewed   AEROBIC CULTURE W GRAM STAIN (SUPERFICIAL SPECIMEN)    EKG   RADIOLOGY   Official radiology report(s): No results found.  PROCEDURES and INTERVENTIONS:  .Marland KitchenIncision and Drainage  Date/Time: 10/14/2022 6:37 AM  Performed by: Delton Prairie, MD Authorized by: Delton Prairie, MD   Consent:    Consent obtained:  Verbal   Consent given by:  Patient   Risks, benefits, and alternatives were discussed: yes   Location:    Type:  Abscess   Location:  Trunk   Trunk location:  Back Anesthesia:    Anesthesia method:  Local infiltration   Local anesthetic:  Lidocaine 1% w/o epi Procedure type:    Complexity:  Complex Procedure details:    Ultrasound guidance: no     Needle aspiration: no     Incision types:  Cruciate   Wound management:  Probed and deloculated   Drainage:  Bloody and purulent   Drainage amount:  Moderate   Wound treatment:  Drain placed   Packing materials:  1/4 in iodoform gauze Post-procedure details:  Procedure completion:  Tolerated well, no immediate complications   Medications  lidocaine (PF) (XYLOCAINE) 1 % injection 15 mL (15 mLs Infiltration Given by Other 10/14/22 5625)  sulfamethoxazole-trimethoprim (BACTRIM DS) 800-160 MG per tablet 1 tablet (1 tablet Oral Given 10/14/22 6389)  ketorolac (TORADOL) 30 MG/ML injection 30 mg (30 mg Intramuscular Given 10/14/22 3734)     IMPRESSION / MDM / ASSESSMENT AND PLAN / ED COURSE  I reviewed the triage vital signs and the nursing notes.  Differential diagnosis includes, but is not limited to, abscess, cellulitis, sepsis  {Patient presents with symptoms of an acute illness or injury that is potentially life-threatening.  47 year old woman presents with an abscess to her back requiring bedside I&D and starting antibiotics.  Look systemically well and denies systemic symptoms at home.  Just localized pain.  I&D performed and yields large volume purulent material and significant improvement of her pain.  Probed  and deloculated to the best of my ability and placed iodoform packing.  Started the patient on Bactrim and sent a wound culture with this purulence.  She already has follow-up scheduled with dermatology, which is fortuitous timing.  We discussed appropriate ED return precautions  Clinical Course as of 10/14/22 0641  Thu Oct 14, 2022  2876 I&D performed and well tolerated. Large volume bloody purulence [DS]    Clinical Course User Index [DS] Delton Prairie, MD     FINAL CLINICAL IMPRESSION(S) / ED DIAGNOSES   Final diagnoses:  Abscess     Rx / DC Orders   ED Discharge Orders          Ordered    sulfamethoxazole-trimethoprim (BACTRIM DS) 800-160 MG tablet  2 times daily        10/14/22 8115             Note:  This document was prepared using Dragon voice recognition software and may include unintentional dictation errors.   Delton Prairie, MD 10/14/22 [phone]

## 2022-10-14 NOTE — ED Triage Notes (Signed)
Patient ambulatory to triage with steady gait, without difficulty or distress noted; pt reports hx hidradenitis; here for abscess to back x wk

## 2022-10-14 NOTE — Discharge Instructions (Addendum)
Please take the Bactrim antibiotic twice daily for the next 10 days.   As we discussed, there is packing material within the abscess cavity that should fall out on its own.  Please follow-up with the dermatologist when you see them.  They can remove the packing if it is still present  If you develop any worsening symptoms in the meantime please return to the ED or follow-up with your PCP

## 2022-10-17 LAB — AEROBIC CULTURE W GRAM STAIN (SUPERFICIAL SPECIMEN)
Gram Stain: NONE SEEN
Special Requests: NORMAL

## 2023-02-13 ENCOUNTER — Other Ambulatory Visit: Payer: Self-pay

## 2023-02-13 ENCOUNTER — Emergency Department
Admission: EM | Admit: 2023-02-13 | Discharge: 2023-02-13 | Disposition: A | Discharge status: Home / Self Care | Payer: MEDICAID | Attending: Emergency Medicine | Admitting: Emergency Medicine

## 2023-02-13 DIAGNOSIS — L03317 Cellulitis of buttock: Secondary | ICD-10-CM

## 2023-02-13 LAB — POC URINE PREG, ED: Preg Test, Ur: NEGATIVE

## 2023-02-13 MED ORDER — ONDANSETRON 4 MG PO TBDP: 4.0000 mg | ORAL_TABLET | Freq: Three times a day (TID) | ORAL | 0 refills | Status: AC | PRN

## 2023-02-13 MED ORDER — CLINDAMYCIN HCL 150 MG PO CAPS
300.0000 mg | ORAL_CAPSULE | Freq: Once | ORAL | Status: AC
  Administered 2023-02-13: 300 mg via ORAL
  Filled 2023-02-13: qty 2

## 2023-02-13 MED ORDER — CLINDAMYCIN HCL 300 MG PO CAPS: 300.0000 mg | ORAL_CAPSULE | Freq: Four times a day (QID) | ORAL | 0 refills | Status: AC

## 2023-02-13 MED ORDER — ONDANSETRON 8 MG PO TBDP
8.0000 mg | ORAL_TABLET | Freq: Once | ORAL | Status: AC
  Administered 2023-02-13: 8 mg via ORAL
  Filled 2023-02-13: qty 1

## 2023-02-13 NOTE — ED Triage Notes (Signed)
Pt reports nausea, chills and rash to right buttocks. Pt states area is red and bumpy, pt has hx HS rash to same area. Pt taking doxycycline

## 2023-02-13 NOTE — ED Provider Notes (Signed)
[facility] Provider Note  Patient Contact: 9:56 PM (approximate)   History   No chief complaint on file.   HPI  Amy Cisneros is a 47 y.o. female who presents the emergency department complaining of a possible spread of her at bedtime or possible cellulitis to the right buttock.  Patient noticed some pain, redness, erythema developing to her buttock.  She has a history of at bedtime and has had symptoms in this place in the past.  She is on doxycycline daily at 100 mg.  No drainage.  Patient does not believe it has abscess.  She denies any fevers or chills at this time.     Physical Exam   Triage Vital Signs: ED Triage Vitals  Encounter Vitals Group     BP 02/13/23 1928 (!) 168/87     Systolic BP Percentile --      Diastolic BP Percentile --      Pulse Rate 02/13/23 1928 93     Resp 02/13/23 1928 18     Temp 02/13/23 1928 98.4 F (36.9 C)     Temp Source 02/13/23 1928 Oral     SpO2 02/13/23 1928 100 %     Weight 02/13/23 1927 (!) 360 lb (163.3 kg)     Height 02/13/23 1927 5\' 6"  (1.676 m)     Head Circumference --      Peak Flow --      Pain Score 02/13/23 1927 6     Pain Loc --      Pain Education --      Exclude from Growth Chart --     Most recent vital signs: Vitals:   02/13/23 1928 02/13/23 1929  BP: (!) 168/87 (!) 168/87  Pulse: 93 89  Resp: 18 18  Temp: 98.4 F (36.9 C) 98.4 F (36.9 C)  SpO2: 100% 98%     General: Alert and in no acute distress.  Cardiovascular:  Good peripheral perfusion Respiratory: Normal respiratory effort without tachypnea or retractions. Lungs CTAB.  Musculoskeletal: Full range of motion to all extremities.  Neurologic:  No gross focal neurologic deficits are appreciated.  Skin:   Visualization of the right buttock reveals findings consistent with cellulitis without evidence of abscess Other:   ED Results / Procedures / Treatments   Labs (all labs ordered are listed, but only abnormal results  are displayed) Labs Reviewed  POC URINE PREG, ED     EKG     RADIOLOGY    No results found.  PROCEDURES:  Critical Care performed: No  Procedures   MEDICATIONS ORDERED IN ED: Medications  clindamycin (CLEOCIN) capsule 300 mg (has no administration in time range)  ondansetron (ZOFRAN-ODT) disintegrating tablet 8 mg (has no administration in time range)     IMPRESSION / MDM / ASSESSMENT AND PLAN / ED COURSE  I reviewed the triage vital signs and the nursing notes.                                 Differential diagnosis includes, but is not limited to, cellulitis of the buttock, abscess, at bedtime, dermatitis   Patient's presentation is most consistent with acute presentation with potential threat to life or bodily function.   Patient's diagnosis is consistent with cellulitis of the buttock.  Patient presents the emergency department complaining of possible infection to the right buttock.  She has a history of at bedtime and has  had symptoms at this location in the past.  Denies any drainage from the area.  Findings on exam are consistent with cellulitis.  Antibiotics prescribed.  Follow-up primary care or dermatology as needed..  Patient is given ED precautions to return to the ED for any worsening or new symptoms.     FINAL CLINICAL IMPRESSION(S) / ED DIAGNOSES   Final diagnoses:  Cellulitis of buttock     Rx / DC Orders   ED Discharge Orders          Ordered    clindamycin (CLEOCIN) 300 MG capsule  4 times daily        02/13/23 2156    ondansetron (ZOFRAN-ODT) 4 MG disintegrating tablet  Every 8 hours PRN        02/13/23 2156             Note:  This document was prepared using Dragon voice recognition software and may include unintentional dictation errors.   Lanette Hampshire 02/13/23 2157    Pilar Jarvis, MD 02/14/23 [phone]

## 2023-03-05 ENCOUNTER — Encounter: Payer: Self-pay | Admitting: Emergency Medicine

## 2023-03-05 ENCOUNTER — Other Ambulatory Visit: Payer: Self-pay

## 2023-03-05 ENCOUNTER — Emergency Department: Payer: MEDICAID

## 2023-03-05 ENCOUNTER — Emergency Department
Admission: EM | Admit: 2023-03-05 | Discharge: 2023-03-05 | Disposition: A | Discharge status: Home / Self Care | Payer: MEDICAID | Attending: Emergency Medicine | Admitting: Emergency Medicine

## 2023-03-05 DIAGNOSIS — R519 Headache, unspecified: Secondary | ICD-10-CM | POA: Diagnosis present

## 2023-03-05 DIAGNOSIS — L03211 Cellulitis of face: Secondary | ICD-10-CM | POA: Diagnosis not present

## 2023-03-05 LAB — COMPREHENSIVE METABOLIC PANEL
ALT: 41 U/L (ref 0–44)
AST: 25 U/L (ref 15–41)
Albumin: 3.1 g/dL — ABNORMAL LOW (ref 3.5–5.0)
Alkaline Phosphatase: 70 U/L (ref 38–126)
Anion gap: 10 (ref 5–15)
BUN: 13 mg/dL (ref 6–20)
CO2: 25 mmol/L (ref 22–32)
Calcium: 8.7 mg/dL — ABNORMAL LOW (ref 8.9–10.3)
Chloride: 102 mmol/L (ref 98–111)
Creatinine, Ser: 0.91 mg/dL (ref 0.44–1.00)
GFR, Estimated: >60 mL/min (ref >60)
Glucose, Bld: 129 mg/dL — ABNORMAL HIGH (ref 70–99)
Potassium: 4.4 mmol/L (ref 3.5–5.1)
Sodium: 137 mmol/L (ref 135–145)
Total Bilirubin: 0.4 mg/dL (ref 0.3–1.2)
Total Protein: 7.5 g/dL (ref 6.5–8.1)

## 2023-03-05 LAB — CBC WITH DIFFERENTIAL/PLATELET
Abs Immature Granulocytes: 0.04 10*3/uL (ref 0.00–0.07)
Basophils Absolute: 0.1 10*3/uL (ref 0.0–0.1)
Basophils Relative: 0 %
Eosinophils Absolute: 0.2 10*3/uL (ref 0.0–0.5)
Eosinophils Relative: 2 %
HCT: 40.7 % (ref 36.0–46.0)
Hemoglobin: 12.8 g/dL (ref 12.0–15.0)
Immature Granulocytes: 0 %
Lymphocytes Relative: 34 %
Lymphs Abs: 4.6 10*3/uL — ABNORMAL HIGH (ref 0.7–4.0)
MCH: 28 pg (ref 26.0–34.0)
MCHC: 31.4 g/dL (ref 30.0–36.0)
MCV: 89.1 fL (ref 80.0–100.0)
Monocytes Absolute: 0.7 10*3/uL (ref 0.1–1.0)
Monocytes Relative: 5 %
Neutro Abs: 8.1 10*3/uL — ABNORMAL HIGH (ref 1.7–7.7)
Neutrophils Relative %: 59 %
Platelets: 398 10*3/uL (ref 150–400)
RBC: 4.57 MIL/uL (ref 3.87–5.11)
RDW: 16.6 % — ABNORMAL HIGH (ref 11.5–15.5)
Smear Review: NORMAL
WBC: 13.8 10*3/uL — ABNORMAL HIGH (ref 4.0–10.5)
nRBC: 0 % (ref 0.0–0.2)

## 2023-03-05 MED ORDER — CLINDAMYCIN HCL 150 MG PO CAPS
300.0000 mg | ORAL_CAPSULE | Freq: Once | ORAL | Status: AC
  Administered 2023-03-05: 300 mg via ORAL
  Filled 2023-03-05: qty 2

## 2023-03-05 MED ORDER — OXYCODONE-ACETAMINOPHEN 5-325 MG PO TABS
1.0000 | ORAL_TABLET | Freq: Once | ORAL | Status: AC
  Administered 2023-03-05: 1 via ORAL
  Filled 2023-03-05: qty 1

## 2023-03-05 MED ORDER — IOHEXOL 300 MG/ML  SOLN
75.0000 mL | Freq: Once | INTRAMUSCULAR | Status: AC | PRN
  Administered 2023-03-05: 75 mL via INTRAVENOUS

## 2023-03-05 MED ORDER — CLINDAMYCIN HCL 300 MG PO CAPS: 300.0000 mg | ORAL_CAPSULE | Freq: Four times a day (QID) | ORAL | 0 refills | Status: AC

## 2023-03-05 NOTE — ED Notes (Signed)
Pt up ambulating independently to restroom

## 2023-03-05 NOTE — ED Provider Notes (Signed)
[facility] Provider Note  Patient Contact: 7:45 PM (approximate)   History   Facial Swelling   HPI  Amy Cisneros is a 47 y.o. female who presents the emergency department complaining of left facial edema, pain and erythema.  Patient states that she believes that she has had a cyst in her left cheek that has been there for years without complications.  She started to develop erythema, edema and pain to the area.  This has been swelling to include the lower half of her eyelid, left cheek.  Patient denies any visual changes but does endorse eye pain, headache on the left side.  Patient denies fevers, chills.  Patient does have a history of at bedtime and has had repetitive skin infections.  Patient was seen by me roughly 3 weeks ago for cellulitis and location.  She did complete her antibiotic course at that time.     Physical Exam   Triage Vital Signs: ED Triage Vitals  Encounter Vitals Group     BP --      Systolic BP Percentile --      Diastolic BP Percentile --      Pulse Rate 03/05/23 1709 81     Resp 03/05/23 1709 16     Temp 03/05/23 1709 98.9 F (37.2 C)     Temp Source 03/05/23 1709 Oral     SpO2 03/05/23 1709 98 %     Weight 03/05/23 1710 (!) 350 lb (158.8 kg)     Height 03/05/23 1710 5\' 6"  (1.676 m)     Head Circumference --      Peak Flow --      Pain Score 03/05/23 1709 5     Pain Loc --      Pain Education --      Exclude from Growth Chart --     Most recent vital signs: Vitals:   03/05/23 1709 03/05/23 2029  BP:  (!) 164/108  Pulse: 81 74  Resp: 16 20  Temp: 98.9 F (37.2 C) 98.1 F (36.7 C)  SpO2: 98% 98%     General: Alert and in no acute distress. Eyes:  PERRL. EOMI. visualization of the left face reveals erythema and edema of the left lower eyelid extending into the cheek.  There is palpable firmness with no fluctuance.  Area is tender to palpation.  Ocular motion is still intact. Head: No acute traumatic findings.   See above note for cellulitic changes to the left lower eyelid and left cheek ENT:      Ears:       Nose: No congestion/rhinnorhea.      Mouth/Throat: Mucous membranes are moist.  Cardiovascular:  Good peripheral perfusion Respiratory: Normal respiratory effort without tachypnea or retractions. Lungs CTAB.  Musculoskeletal: Full range of motion to all extremities.  Neurologic:  No gross focal neurologic deficits are appreciated.  Skin:   No rash noted Other:   ED Results / Procedures / Treatments   Labs (all labs ordered are listed, but only abnormal results are displayed) Labs Reviewed  COMPREHENSIVE METABOLIC PANEL - Abnormal; Notable for the following components:      Result Value   Glucose, Bld 129 (*)    Calcium 8.7 (*)    Albumin 3.1 (*)    All other components within normal limits  CBC WITH DIFFERENTIAL/PLATELET - Abnormal; Notable for the following components:   WBC 13.8 (*)    RDW 16.6 (*)    Neutro Abs 8.1 (*)  Lymphs Abs 4.6 (*)    All other components within normal limits     EKG     RADIOLOGY  I personally viewed, evaluated, and interpreted these images as part of my medical decision making, as well as reviewing the written report by the radiologist.  ED Provider Interpretation: Facial cellulitis with no orbital cellulitis.  There is a small 4 mm lesion, it appears that this is a cyst not an abscess.  CT Maxillofacial W Contrast  Result Date: 03/05/2023 CLINICAL DATA:  Initial evaluation for acute left facial swelling, infection. EXAM: CT MAXILLOFACIAL WITH CONTRAST TECHNIQUE: Multidetector CT imaging of the maxillofacial structures was performed with intravenous contrast. Multiplanar CT image reconstructions were also generated. RADIATION DOSE REDUCTION: This exam was performed according to the departmental dose-optimization program which includes automated exposure control, adjustment of the mA and/or kV according to patient size and/or use of iterative  reconstruction technique. CONTRAST:  75mL OMNIPAQUE IOHEXOL 300 MG/ML  SOLN COMPARISON:  None Available. FINDINGS: Osseous: No acute osseous abnormality. Orbits: Globes and orbital soft tissues within normal limits. No evidence for intraorbital or postseptal cellulitis. Sinuses: Paranasal sinuses are largely clear. Mastoid air cells are clear. Soft tissues: Soft tissue swelling with hazy inflammatory stranding seen involving the left infraorbital soft tissues, suspicious for infection/cellulitis. 4 mm hypodensity just deep to the skin in this region suspicious for a tiny abscess (series 2, image 57). No other drainable fluid collection. No postseptal or intraorbital extension of infection at this time. Limited intracranial: Unremarkable. IMPRESSION: Soft tissue swelling with hazy inflammatory stranding involving the left infraorbital soft tissues, suspicious for infection/cellulitis. 4 mm hypodensity just deep to the skin in this region suspicious for a tiny abscess. No postseptal or intraorbital extension of infection at this time. Electronically Signed   By: Rise Mu M.D.   On: 03/05/2023 23:00    PROCEDURES:  Critical Care performed: No  Procedures   MEDICATIONS ORDERED IN ED: Medications  clindamycin (CLEOCIN) capsule 300 mg (has no administration in time range)  oxyCODONE-acetaminophen (PERCOCET/ROXICET) 5-325 MG per tablet 1 tablet (1 tablet Oral Given 03/05/23 2055)  iohexol (OMNIPAQUE) 300 MG/ML solution 75 mL (75 mLs Intravenous Contrast Given 03/05/23 2212)     IMPRESSION / MDM / ASSESSMENT AND PLAN / ED COURSE  I reviewed the triage vital signs and the nursing notes.                                 Differential diagnosis includes, but is not limited to, facial cellulitis, orbital cellulitis, periorbital cellulitis   Patient's presentation is most consistent with acute presentation with potential threat to life or bodily function.   Patient's diagnosis is consistent  with periorbital cellulitis/facial cellulitis.  Patient presents emergency department with what appears to be cellulitis to the left cheek that is extending up into the left eye.  CT scan is reassuring with no true abscess and no orbital involvement.  Patient will be placed on oral antibiotics.  Follow-up primary care as needed.  Return precautions discussed with the patient..  Patient is given ED precautions to return to the ED for any worsening or new symptoms.     FINAL CLINICAL IMPRESSION(S) / ED DIAGNOSES   Final diagnoses:  Cellulitis, face     Rx / DC Orders   ED Discharge Orders          Ordered    clindamycin (CLEOCIN) 300 MG capsule  4 times daily        03/05/23 2341             Note:  This document was prepared using Dragon voice recognition software and may include unintentional dictation errors.   Lanette Hampshire 03/05/23 2342    Sharman Cheek, MD 03/08/23 1535

## 2023-03-05 NOTE — ED Triage Notes (Signed)
Pt to the ED via POV c/o swelling on the left side of her face, near her eye. Pt denies fever or chills. Pt is in NAD.

## 2023-03-10 ENCOUNTER — Encounter: Payer: Self-pay | Admitting: Obstetrics and Gynecology

## 2023-03-10 ENCOUNTER — Ambulatory Visit (INDEPENDENT_AMBULATORY_CARE_PROVIDER_SITE_OTHER): Payer: MEDICAID | Admitting: Obstetrics and Gynecology

## 2023-03-10 VITALS — BP 143/84 | HR 96 | Ht 66.0 in | Wt 363.0 lb

## 2023-03-10 DIAGNOSIS — Z3202 Encounter for pregnancy test, result negative: Secondary | ICD-10-CM

## 2023-03-10 DIAGNOSIS — N926 Irregular menstruation, unspecified: Secondary | ICD-10-CM

## 2023-03-10 DIAGNOSIS — Z3169 Encounter for other general counseling and advice on procreation: Secondary | ICD-10-CM

## 2023-03-10 DIAGNOSIS — Z8481 Family history of carrier of genetic disease: Secondary | ICD-10-CM

## 2023-03-10 DIAGNOSIS — Z01419 Encounter for gynecological examination (general) (routine) without abnormal findings: Secondary | ICD-10-CM | POA: Diagnosis not present

## 2023-03-10 DIAGNOSIS — Z1231 Encounter for screening mammogram for malignant neoplasm of breast: Secondary | ICD-10-CM

## 2023-03-10 DIAGNOSIS — A630 Anogenital (venereal) warts: Secondary | ICD-10-CM

## 2023-03-10 LAB — POCT URINE PREGNANCY: Preg Test, Ur: NEGATIVE

## 2023-03-10 NOTE — Patient Instructions (Signed)
I value your feedback and you entrusting us with your care. If you get a [facility] patient survey, I would appreciate you taking the time to let us know about your experience today. Thank you! ? ? ?

## 2023-03-10 NOTE — Progress Notes (Signed)
PCP:  Garnet Koyanagi, FNP   Chief Complaint  Patient presents with   Gynecologic Exam    UPT missed AUG cycle     HPI:      [name] is a 47 y.o. [id number] who LMP was Patient's last menstrual period was 02/02/2023., presents today for her annual examination.  Her menses are regular every 28-30 days, lasting 6 days, mod flow.  Dysmenorrhea mild, occurring first 1-2 days of flow, improved with NSAIDs. She does not have intermenstrual bleeding. No 8/24 menses; hx of amenorrhea due to meds a few yrs ago.    Sex activity: currently sexually active-- contraception none. 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 Saw derm who did bx and referred pt to gen surg for excision. Had excised 2/23; doing well but thinks there are some new lesions.   Last Pap: 07/08/21 Results were: no abnormalities /neg HPV DNA. Hx of mult abn paps. 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   Last mammogram: 03/09/23 at [facility]. Results were normal, repeat in 12 months. There is a strong FH of breast cancer on her mat side (mom, sister, mat aunt, niece). Affected niece is BRCA pos, pt is MyRisk neg 2018. IBIS=18%. There is a FH of ovarian cancer in her mat aunt. The patient does do self-breast exams.  Tobacco use: The patient currently smokes 1/2 packs of cigarettes per day for the past many years. Alcohol use: none No drug use.  Exercise: min active  She does get adequate calcium but not Vitamin D in her diet; hx of Vit D deficiency.  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) DNA test positive    COPD (chronic obstructive pulmonary disease) (HCC)    Diabetes mellitus without complication (HCC)    Family history of BRCA gene mutation    mat side   Family history of breast cancer    Family  history of ovarian cancer    Fatty liver    Genital warts    Hidradenitis suppurativa    LGSIL on Pap smear of cervix 2014   Neuropathy    Obesity    Sleep apnea     Past Surgical History:  Procedure Laterality Date   CHOLECYSTECTOMY     CONDYLOMA EXCISION/FULGURATION Right 08/12/2021   Procedure: CONDYLOMA REMOVAL;  Surgeon: Campbell Lerner, MD;  Location: [facility] ORS;  Service: General;  Laterality: Right;   GALLBLADDER SURGERY      Family History  Problem Relation Age of Onset   Breast cancer Mother 23   Diabetes Mother    Breast cancer Sister 14   Breast cancer Maternal Aunt 40   Ovarian cancer Maternal Aunt 50   Breast cancer Other 58       BRCA pos (daughter of affected aunt with breast cancer)   Diabetes Father    Kidney disease Father     Social History   Socioeconomic History   Marital status: Single    Spouse name: Not on file   Number of children: Not on file   Years of education: Not on file   Highest education level: Not on file  Occupational History   Not on file  Tobacco Use   Smoking status: Every  Day    Current packs/day: 0.50    Types: Cigarettes   Smokeless tobacco: Never  Vaping Use   Vaping status: Former  Substance and Sexual Activity   Alcohol use: Yes    Comment: occassional   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     albuterol (PROVENTIL HFA;VENTOLIN HFA) 108 (90 Base) MCG/ACT inhaler Inhale 2 puffs into the lungs every 6 (six) hours as needed for wheezing or shortness of breath.     Buprenorphine HCl-Naloxone HCl 8-2 MG FILM Place 0.5 Film under the tongue in the morning, at noon, in the evening, and at bedtime.     clindamycin (CLEOCIN) 300 MG capsule Take 1 capsule (300 mg total) by mouth 4 (four) times daily for 7 days. 28 capsule 0   cyclobenzaprine (FLEXERIL) 10 MG tablet Take 10 mg by mouth at bedtime.     FLUoxetine (PROZAC) 20 MG capsule Take 20 mg by mouth daily.     gabapentin (NEURONTIN) 300 MG capsule Take 300 mg by mouth daily as needed (pain).     lisinopril-hydrochlorothiazide (PRINZIDE,ZESTORETIC) 10-12.5 MG tablet Take 1 tablet by mouth daily.     ondansetron (ZOFRAN-ODT) 4 MG disintegrating tablet Take 1 tablet (4 mg total) by mouth every 8 (eight) hours as needed. 20 tablet 0   pregabalin (LYRICA) 100 MG capsule Take 100 mg by mouth 2 (two) times daily.     rOPINIRole (REQUIP) 0.5 MG tablet Take 0.5 mg by mouth at bedtime.     metFORMIN (GLUCOPHAGE) 1000 MG tablet Take 1 tablet (1,000 mg total) by mouth 2 (two) times daily with a meal. 60 tablet 0   traZODone (DESYREL) 150 MG tablet Take 150 mg by mouth at bedtime as needed for sleep.     triamcinolone cream (KENALOG) 0.1 % Apply 1 application topically daily as needed (irritation).     No facility-administered medications prior to visit.     ROS:  Review of Systems  Constitutional:  Negative for fatigue, fever and unexpected weight change.  Respiratory:  Negative for cough, shortness of breath and wheezing.   Cardiovascular:  Negative for chest pain, palpitations and leg swelling.  Gastrointestinal:  Negative for blood in stool, constipation, diarrhea, nausea and vomiting.  Endocrine: Negative for cold intolerance, heat intolerance and polyuria.  Genitourinary:  Negative for dyspareunia, dysuria, flank  pain, frequency, genital sores, hematuria, menstrual problem, pelvic pain, urgency, vaginal bleeding, vaginal discharge and vaginal pain.  Musculoskeletal:  Negative for back pain, joint swelling and myalgias.  Skin:  Negative for rash.  Neurological:  Negative for dizziness, syncope, light-headedness, numbness and headaches.  Hematological:  Negative for adenopathy.  Psychiatric/Behavioral:  Negative for agitation, confusion, sleep disturbance and suicidal ideas. The patient is not nervous/anxious.   BREAST: No symptoms  Objective: BP (!) 143/84   Pulse 96   Ht 5\' 6"  (1.676 m)   Wt (!) 363 lb (164.7 kg)   LMP 02/02/2023   BMI 58.59 kg/m    Physical Exam Constitutional:      Appearance: She is well-developed.  Genitourinary:     Vulva normal.     Right Labia: lesions.     Right Labia: No rash or tenderness.    Left Labia: No tenderness, lesions or rash.       Vaginal bleeding present.     No vaginal discharge, erythema or tenderness.      Right Adnexa: not tender and no mass present.    Left Adnexa: not tender and no mass present.    No cervical motion tenderness, friability or polyp.     Uterus is not enlarged or tender.  Breasts:    Right: No mass, nipple discharge, skin change or tenderness.     Left: No mass, nipple discharge, skin change or tenderness.  Neck:     Thyroid: No thyromegaly.  Cardiovascular:     Rate and Rhythm: Normal rate and regular rhythm.     Heart sounds: Normal heart sounds. No murmur heard. Pulmonary:     Effort: Pulmonary effort is normal.     Breath sounds: Normal breath sounds.  Abdominal:     Palpations: Abdomen is soft.     Tenderness: There is no abdominal tenderness. There is no guarding or rebound.  Musculoskeletal:        General: Normal range of motion.     Cervical back: Normal range of motion.  Lymphadenopathy:     Cervical: No cervical adenopathy.  Neurological:     General: No focal deficit present.     Mental Status:  She is alert and oriented to person, place, and time.     Cranial Nerves: No cranial nerve deficit.  Skin:    General: Skin is warm and dry.  Psychiatric:        Mood and Affect: Mood normal.        Behavior: Behavior normal.        Thought Content: Thought content normal.        Judgment: Judgment normal.  Vitals reviewed.    Results for orders placed or performed in visit on 03/10/23 (from the past 24 hour(s))  POCT urine pregnancy     Status: Normal   Collection Time: 03/10/23  4:35 PM  Result Value Ref Range   Preg Test, Ur Negative Negative    Assessment/Plan: Encounter for annual routine gynecological examination  Encounter for screening mammogram for malignant neoplasm of breast; pt had mammo yesterday  Family history of BRCA gene positive--pt is BRCA/MyRisk neg. Cont routine mammo screening yearly.   Late menses - Plan: POCT urine pregnancy; neg UPT. Take first AM UPT in a few days if no menses. Follow expectantly  Genital warts--treated with TCA today, wash in 4-6 hrs. RTO in 4 wks if sx persist. Trying to prevent lesions from getting as big this time.   Pre-conception counseling--start PNVs, declines BC. Conception ok. Discussed need to change to different HTN med if pregnant, ? Requip use. Also discussed she would be high risk for pregnancy. Pt to f/u prn.      GYN counsel breast self exam, mammography screening, adequate intake of calcium and vitamin D, diet and exercise     F/U  Return in about 1 year (around 03/09/2024).  Arjen Deringer B. Lakeyta Vandenheuvel, PA-C 03/10/2023 5:02 PM

## 2023-03-11 DIAGNOSIS — I10 Essential (primary) hypertension: Secondary | ICD-10-CM | POA: Insufficient documentation

## 2023-03-11 DIAGNOSIS — G4733 Obstructive sleep apnea (adult) (pediatric): Secondary | ICD-10-CM | POA: Insufficient documentation

## 2023-03-11 DIAGNOSIS — E11610 Type 2 diabetes mellitus with diabetic neuropathic arthropathy: Secondary | ICD-10-CM | POA: Insufficient documentation

## 2023-03-11 DIAGNOSIS — E1159 Type 2 diabetes mellitus with other circulatory complications: Secondary | ICD-10-CM | POA: Insufficient documentation

## 2023-03-17 ENCOUNTER — Telehealth: Payer: Self-pay

## 2023-03-17 DIAGNOSIS — N912 Amenorrhea, unspecified: Secondary | ICD-10-CM

## 2023-03-17 NOTE — Telephone Encounter (Signed)
Pt called triage and wants to let [facility] know still no period. She has pregnancy sx: frequent urination, morning sickness, pelvic pain, low back pain, emotional, fatigue. Took pregnancy test this morning and it was negative. She wants to know if she can have blood work to see if she's pregnant or does she need to wait a little longer? Says her periods are never late. Aware [facility] out of office and reply might take a little longer than usual.

## 2023-03-18 NOTE — Telephone Encounter (Signed)
Yes, pls place order for her to have serum beta. Thx.

## 2023-03-18 NOTE — Telephone Encounter (Signed)
Beta ordered, transferred pt to front desk to schedule lab appt.

## 2023-03-22 ENCOUNTER — Other Ambulatory Visit: Payer: MEDICAID

## 2023-03-22 DIAGNOSIS — N912 Amenorrhea, unspecified: Secondary | ICD-10-CM

## 2023-03-23 ENCOUNTER — Telehealth: Payer: Self-pay

## 2023-03-23 NOTE — Telephone Encounter (Signed)
Pt aware of hcg results and would like [facility] to call to discuss what's next since her period is late.

## 2023-03-24 NOTE — Telephone Encounter (Signed)
Pt aware.

## 2023-03-24 NOTE — Telephone Encounter (Signed)
Pt to wait to see if period comes this month on schedule. Can be normal to miss period. If no period in 3 months since LMP, will give med to start period.

## 2023-04-05 ENCOUNTER — Ambulatory Visit: Payer: MEDICAID | Admitting: Obstetrics and Gynecology

## 2023-05-20 NOTE — Progress Notes (Unsigned)
Sleep Medicine   Office Visit  Patient Name: Amy Cisneros DOB: Sep 30, 1975 MRN [id number]    Chief Complaint: ***  Brief History:  Amy Cisneros presents for an initial consult for sleep evaluation and to establish care. The patient has a 1 year history of sleep apnea and is currently on a CPAP. Prior to using a PAP, sleep quality was ***. This was noted *** nights. The patient reported the following symptoms:  *** . The patient goes to sleep at *** and wakes up at ***. The patient relates *** as a history of psychiatric problems. The Epworth Sleepiness Score is *** out of 24 . The patient relates  Cardiovascular risk factors include: ***. The patient is currently on a CPAP@ 17 cmH2O. The patient reports *** her PAP and feels *** after sleeping with PAP.  The patient reports *** from PAP use and would like for her to continue using PAP. Reported sleepiness is  ***. The compliance download shows  ***% compliance with an average use time of *** hours ***  minutes. The AHI is ***.  The patient continues to require PAP therapy as a medical necessity in order to eliminate her sleep apnea.    ROS  General: (-) fever, (-) chills, (-) night sweat Nose and Sinuses: (-) nasal stuffiness or itchiness, (-) postnasal drip, (-) nosebleeds, (-) sinus trouble. Mouth and Throat: (-) sore throat, (-) hoarseness. Neck: (-) swollen glands, (-) enlarged thyroid, (-) neck pain. Respiratory: *** cough, *** shortness of breath, *** wheezing. Neurologic: *** numbness, *** tingling. Psychiatric: *** anxiety, *** depression Sleep behavior: ***sleep paralysis ***hypnogogic hallucinations ***dream enactment      ***vivid dreams ***cataplexy ***night terrors ***sleep walking   Current Medication: Outpatient Encounter Medications as of 05/23/2023  Medication Sig Note   albuterol (ACCUNEB) 0.63 MG/3ML nebulizer solution Take 1 ampule by nebulization every 6 (six) hours as needed for wheezing.    albuterol (PROVENTIL  HFA;VENTOLIN HFA) 108 (90 Base) MCG/ACT inhaler Inhale 2 puffs into the lungs every 6 (six) hours as needed for wheezing or shortness of breath.    Buprenorphine HCl-Naloxone HCl 8-2 MG FILM Place 0.5 Film under the tongue in the morning, at noon, in the evening, and at bedtime.    cyclobenzaprine (FLEXERIL) 10 MG tablet Take 10 mg by mouth at bedtime.    FLUoxetine (PROZAC) 20 MG capsule Take 20 mg by mouth daily.    gabapentin (NEURONTIN) 300 MG capsule Take 300 mg by mouth daily as needed (pain).    lisinopril-hydrochlorothiazide (PRINZIDE,ZESTORETIC) 10-12.5 MG tablet Take 1 tablet by mouth daily. 07/31/2021: Needs Refilled    metFORMIN (GLUCOPHAGE) 1000 MG tablet Take 1 tablet (1,000 mg total) by mouth 2 (two) times daily with a meal.    ondansetron (ZOFRAN-ODT) 4 MG disintegrating tablet Take 1 tablet (4 mg total) by mouth every 8 (eight) hours as needed.    pregabalin (LYRICA) 100 MG capsule Take 100 mg by mouth 2 (two) times daily.    rOPINIRole (REQUIP) 0.5 MG tablet Take 0.5 mg by mouth at bedtime.    No facility-administered encounter medications on file as of 05/23/2023.    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) DNA test positive    COPD (chronic obstructive pulmonary disease) (HCC)    Diabetes mellitus without complication (HCC)    Family history of BRCA gene mutation    mat side   Family history of breast cancer    Family history of ovarian cancer    Fatty liver    Genital warts    Hidradenitis suppurativa     LGSIL on Pap smear of cervix 2014   Neuropathy    Obesity    Sleep apnea     Family History: Non contributory to the present illness  Social History: Social History   Socioeconomic History   Marital status: Single    Spouse name: Not on file   Number of children: Not on file   Years of education: Not on file   Highest education level: Not on file  Occupational History   Not on file  Tobacco Use   Smoking status: Every Day    Current packs/day: 0.50    Types: Cigarettes   Smokeless tobacco: Never  Vaping Use   Vaping status: Former  Substance and Sexual Activity   Alcohol use: Yes    Comment: occassional   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 There is no height or weight on file to calculate BMI.   Examination: General Appearance: The patient is well-developed, well-nourished, and in no distress. Neck Circumference: *** Skin: Gross inspection of skin unremarkable. Head: normocephalic, no gross deformities. Eyes: no gross deformities noted. ENT: ears appear grossly normal Neurologic: Alert and oriented. No involuntary movements.    STOP BANG RISK ASSESSMENT S (snore) Have you been told that you snore?     YES/N   T (tired) Are you often tired, fatigued, or sleepy during the day?   YES/NO  O (obstruction) Do you stop breathing, choke, or gasp during sleep? YES/NO   P (pressure) Do you have or are you being treated for high blood pressure? YES/NO   B  (BMI) Is your body index greater than 35 kg/m? YES/NO   A (age) Are you 45 years old or older? NO   N (neck) Do you have a neck circumference greater than 16 inches?   YES/NO   G (gender) Are you a female? NO   TOTAL STOP/BANG "YES" ANSWERS                                                                A STOP-Bang score of 2 or less is considered low risk, and a score of 5  or more is high risk for having either moderate or severe OSA. For people who score 3 or 4, doctors may need to perform further assessment to determine how likely they are to have OSA.         EPWORTH SLEEPINESS SCALE:  Scale:  (0)= no chance of dozing; (1)= slight chance of dozing; (2)= moderate chance of dozing; (3)= high chance of dozing  Chance  Situtation    Sitting and reading: ***    Watching TV: ***    Sitting Inactive in public: ***    As a passenger in car: ***      Lying down to rest: ***    Sitting and talking: ***    Sitting quielty after lunch: ***    In a car, stopped in traffic: ***   TOTAL SCORE:   *** out of 24    SLEEP STUDIES:  Split Study (06/2010 at [facility]) AHI 17/hr, min SpO2 84%, CPAP@ 14 cmH2O Split Study (01/2017 at [facility]) AHI 71/hr, min SpO2 79%, CPAP@ 17 cmH2O   CPAP COMPLIANCE DATA:  Date Range: ***  Average Daily Use: *** hours *** minutes  Median Use: *** hours *** minutes  Compliance for > 4 Hours: *** %  AHI: *** respiratory events per hour  Days Used: *** days  Mask Leak: ***  95th Percentile Pressure: ***   LABS: Recent Results (from the past 2160 hour(s))  Comprehensive metabolic panel     Status: Abnormal   Collection Time: 03/05/23  8:21 PM  Result Value Ref Range   Sodium 137 135 - 145 mmol/L   Potassium 4.4 3.5 - 5.1 mmol/L   Chloride 102 98 - 111 mmol/L   CO2 25 22 - 32 mmol/L   Glucose, Bld 129 (H) 70 - 99 mg/dL    Comment: Glucose reference range applies only to samples taken after fasting for at least 8 hours.   BUN 13 6 - 20 mg/dL    Creatinine, Ser 9.81 0.44 - 1.00 mg/dL   Calcium 8.7 (L) 8.9 - 10.3 mg/dL   Total Protein 7.5 6.5 - 8.1 g/dL   Albumin 3.1 (L) 3.5 - 5.0 g/dL   AST 25 15 - 41 U/L   ALT 41 0 - 44 U/L   Alkaline Phosphatase 70 38 - 126 U/L   Total Bilirubin 0.4 0.3 - 1.2 mg/dL   GFR, Estimated >19 >14 mL/min    Comment: (NOTE) Calculated using the CKD-EPI Creatinine Equation (2021)    Anion gap 10 5 - 15    Comment: Performed at [facility], [address] [place], [place] [address]  CBC with Differential     Status: Abnormal   Collection Time: 03/05/23  8:21 PM  Result Value Ref Range   WBC 13.8 (H) 4.0 - 10.5 K/uL   RBC 4.57 3.87 - 5.11 MIL/uL   Hemoglobin 12.8 12.0 - 15.0 g/dL   HCT 62.1 30.8 - 65.7 %   MCV 89.1 80.0 - 100.0 fL   MCH 28.0 26.0 - 34.0 pg   MCHC 31.4 30.0 - 36.0 g/dL   RDW 84.6 (H) 96.2 - 95.2 %   Platelets 398 150 - 400 K/uL   nRBC 0.0 0.0 - 0.2 %   Neutrophils Relative % 59 %   Neutro Abs 8.1 (H) 1.7 - 7.7 K/uL   Lymphocytes Relative 34 %   Lymphs Abs 4.6 (H) 0.7 - 4.0 K/uL   Monocytes Relative 5 %   Monocytes Absolute 0.7 0.1 -  1.0 K/uL   Eosinophils Relative 2 %   Eosinophils Absolute 0.2 0.0 - 0.5 K/uL   Basophils Relative 0 %   Basophils Absolute 0.1 0.0 - 0.1 K/uL   WBC Morphology MORPHOLOGY UNREMARKABLE    RBC Morphology MORPHOLOGY UNREMARKABLE    Smear Review NORMAL PLATELET MORPHOLOGY    Immature Granulocytes 0 %   Abs Immature Granulocytes 0.04 0.00 - 0.07 K/uL    Comment: Performed at [facility], [address] [place], [place] [address]  POCT urine pregnancy     Status: Normal   Collection Time: 03/10/23  4:35 PM  Result Value Ref Range   Preg Test, Ur Negative Negative  Beta hCG quant (ref lab)     Status: None   Collection Time: 03/22/23  2:59 PM  Result Value Ref Range   hCG Quant <1 mIU/mL    Comment:                      Female (Non-pregnant)    0 -     5                             (Postmenopausal)  0 -     8                       Female (Pregnant)                      Weeks of Gestation                              3                6 -    71                              4               10 -   750                              5              217 -  7138                              6              158 - 31795                              7             [id number] -[phone]                              8            [id number] -[phone]                              9            [id number] [phone]  10            [id number]                             12            [id number] -[phone]                             14            13950 - 62530                             15            [phone] Roche E CLIA methodology     Radiology: CT Maxillofacial W Contrast  Result Date: 03/05/2023 CLINICAL DATA:  Initial evaluation for acute left facial swelling, infection. EXAM: CT MAXILLOFACIAL WITH CONTRAST TECHNIQUE: Multidetector CT imaging of the maxillofacial structures was performed with intravenous contrast. Multiplanar CT image reconstructions were also generated. RADIATION DOSE REDUCTION: This exam was performed according to the departmental dose-optimization program which includes automated exposure control, adjustment of the mA and/or kV according to patient size and/or use of iterative reconstruction technique. CONTRAST:  75mL OMNIPAQUE IOHEXOL 300 MG/ML  SOLN COMPARISON:  None Available. FINDINGS: Osseous: No acute osseous abnormality. Orbits: Globes and orbital soft tissues within normal limits. No evidence for intraorbital or postseptal cellulitis. Sinuses: Paranasal sinuses are largely clear. Mastoid air cells are clear. Soft tissues: Soft tissue swelling with hazy inflammatory stranding seen involving the left infraorbital soft tissues,  suspicious for infection/cellulitis. 4 mm hypodensity just deep to the skin in this region suspicious for a tiny abscess (series 2, image 57). No other drainable fluid collection. No postseptal or intraorbital extension of infection at this time. Limited intracranial: Unremarkable. IMPRESSION: Soft tissue swelling with hazy inflammatory stranding involving the left infraorbital soft tissues, suspicious for infection/cellulitis. 4 mm hypodensity just deep to the skin in this region suspicious for a tiny abscess. No postseptal or intraorbital extension of infection at this time. Electronically Signed   By: Rise Mu M.D.   On: 03/05/2023 23:00    No results found.  No results found.    Assessment and Plan: Patient Active Problem List   Diagnosis Date Noted   Genital warts 03/23/2018   BRCA negative 04/25/2017   Chronic lower extremity pain (Secondary Area of Pain) (Bilateral) (R>L) 02/09/2017   Chronic knee pain [facility] Area of Pain) (Bilateral) (R>L) 02/09/2017   Chronic hip pain (Fourth Area of Pain) (Bilateral) (R>L) 02/09/2017   Chronic shoulder pain (Fifth  Area of Pain) (Bilateral) (R>L) 02/09/2017   Chronic neck pain (Bilateral) (R>L) 02/09/2017   NSAID long-term use 02/09/2017   Osteoarthritis of knee 02/08/2017   Chronic pain syndrome 02/08/2017   Opiate use 02/08/2017    Class: History of   Lumbar paracentral disc bulge at L2-3 (Left) 02/08/2017   Lumbar facet hypertrophy (Bilateral) 02/08/2017   Chronic lumbar radiculitis of L3 (Left) 02/08/2017   Chronic low back pain (Primary Area of Pain) (Bilateral) (L>R) 02/08/2017   Morbid obesity with BMI of 50.0-59.9, adult (HCC) 02/08/2017   Family history of breast cancer 02/01/2017   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) DNA test positive 02/01/2017   Family history of BRCA gene positive 02/01/2017   Dyspepsia 03/26/2014   IBS (irritable bowel syndrome) 03/26/2014     PLAN OSA:   Patient evaluation  suggests high risk of sleep disordered breathing due to *** Patient has comorbid cardiovascular risk factors including: *** which could be exacerbated by pathologic sleep-disordered breathing.  Suggest: *** to assess/treat the patient's sleep disordered breathing. The patient was also counselled on *** to optimize sleep health.  PLAN hypersomnia:  Patient evaluation suggests significant daytime hypersomnia.  The Epworth Sleepiness Score is elevated at *** out of 24. Patient *** drowsy driving. The patient *** MVA due to sleepiness.  The patient *** restless leg symptoms which exacerbate *** for *** nights per week. The patient *** periodic limb movements which exacerbate ***  for *** nights per week. Suggest: ***  Also suggest ***  PLAN insomnia:  Patient evaluation suggests *** insomnia. This is a chronic disorder. This has been a concern for *** and causes impaired daytime functioning. The patient exhibits comorbid ***  The history *** suggest the insomnia predates the use of hypnotic medications. The symptoms *** with the discontinuation of these medications. There is no obvious medical, psychiatric or pharmacologic abuse issues ot account for the insomnia.  Treatment recommendations include: *** The patient should maintain a sleep log and calculate total sleep time for 1-2 weeks. Set bed and wake times for achieve 85% sleep efficiency for one week. Once this is achieved  time in bed can be gradually increased. A pharmacologic treatment approach would include a trial of *** for the next ***  months. During this time the patient is to maintain a sleep diary to track progress.    ***  General Counseling: I have discussed the findings of the evaluation and examination with [facility].  I have also discussed any further diagnostic evaluation thatmay be needed or ordered today. Azalie verbalizes understanding of the findings of todays visit. We also reviewed her medications today and discussed drug  interactions and side effects including but not limited excessive drowsiness and altered mental states. We also discussed that there is always a risk not just to her but also people around her. she has been encouraged to call the office with any questions or concerns that should arise related to todays visit.  No orders of the defined types were placed in this encounter.       I have personally obtained a history, evaluated the patient, evaluated pertinent data, formulated the assessment and plan and placed orders.    Yevonne Pax, MD [facility] Diplomate ABMS Pulmonary and Critical Care Medicine Sleep medicine

## 2023-05-23 ENCOUNTER — Ambulatory Visit: Payer: MEDICAID | Admitting: Internal Medicine

## 2023-08-21 NOTE — Progress Notes (Unsigned)
[facility] [address] [place], [place] [address]  Pulmonary Sleep Medicine   Office Visit Note  Patient Name: Amy Cisneros DOB: 19-Aug-1975 MRN [id number]    Chief Complaint: Obstructive Sleep Apnea visit  Brief History:  Amy Cisneros is seen today for an initial sleep evaluation  to establish care on CPAP @17cmH20 . The patient has a 13 year  history of sleep apnea. Prior to CPAP use patient reports symptoms of  EDS and snoring. Patient is using PAP nightly.  The patient feels fairly rested after sleeping with PAP.  The patient reports benefit from PAP use. Reported sleepiness is  improved and the Epworth Sleepiness Score is 7 out of 24. The patient does take naps with CPAP.  The patient complains of the following: needs new device. End of motor life message.  She also reports having trouble initiating sleep. The compliance download shows 100% compliance with an average use time of 10.5 hours. The AHI is 0.0  The patient does not complain of limb movements disrupting sleep, she is on requip which works well.  Patient's current machine is 47 years old and obsolete for repair. In order to assure reliable therapy, it should be replaced. The screen is displaying an end of motor life message.   ROS  General: (-) fever, (-) chills, (-) night sweat Nose and Sinuses: (-) nasal stuffiness or itchiness, (-) postnasal drip, (-) nosebleeds, (-) sinus trouble. Mouth and Throat: (-) sore throat, (-) hoarseness. Neck: (-) swollen glands, (-) enlarged thyroid, (-) neck pain. Respiratory: + cough, - shortness of breath, + wheezing. Neurologic: - numbness, - tingling. Psychiatric: + anxiety, + depression   Current Medication: Outpatient Encounter Medications as of 08/22/2023  Medication Sig Note   doxycycline (VIBRAMYCIN) 100 MG capsule Take 100 mg by mouth daily.    HUMIRA, 2 PEN, 40 MG/0.4ML pen SMARTSIG:40 Milligram(s) SUB-Q Once a Week    metFORMIN (GLUCOPHAGE) 500 MG tablet Take  1,000 mg by mouth 2 (two) times daily.    OZEMPIC, 1 MG/DOSE, 4 MG/3ML SOPN     albuterol (ACCUNEB) 0.63 MG/3ML nebulizer solution Take 1 ampule by nebulization every 6 (six) hours as needed for wheezing.    albuterol (PROVENTIL HFA;VENTOLIN HFA) 108 (90 Base) MCG/ACT inhaler Inhale 2 puffs into the lungs every 6 (six) hours as needed for wheezing or shortness of breath.    Buprenorphine HCl-Naloxone HCl 8-2 MG FILM Place 0.5 Film under the tongue in the morning, at noon, in the evening, and at bedtime.    cyclobenzaprine (FLEXERIL) 10 MG tablet Take 10 mg by mouth at bedtime.    FLUoxetine (PROZAC) 20 MG capsule Take 20 mg by mouth daily.    gabapentin (NEURONTIN) 300 MG capsule Take 300 mg by mouth daily as needed (pain).    lisinopril-hydrochlorothiazide (PRINZIDE,ZESTORETIC) 10-12.5 MG tablet Take 1 tablet by mouth daily. 07/31/2021: Needs Refilled    ondansetron (ZOFRAN-ODT) 4 MG disintegrating tablet Take 1 tablet (4 mg total) by mouth every 8 (eight) hours as needed.    rOPINIRole (REQUIP) 0.5 MG tablet Take 0.5 mg by mouth at bedtime.    [DISCONTINUED] metFORMIN (GLUCOPHAGE) 1000 MG tablet Take 1 tablet (1,000 mg total) by mouth 2 (two) times daily with a meal.    [DISCONTINUED] pregabalin (LYRICA) 100 MG capsule Take 100 mg by mouth 2 (two) times daily.    No facility-administered encounter medications on file as of 08/22/2023.    Surgical History: Past Surgical History:  Procedure Laterality Date   CHOLECYSTECTOMY  CONDYLOMA EXCISION/FULGURATION Right 08/12/2021   Procedure: CONDYLOMA REMOVAL;  Surgeon: Campbell Lerner, MD;  Location: [facility] ORS;  Service: General;  Laterality: Right;   GALLBLADDER SURGERY      Medical History: Past Medical History:  Diagnosis Date   Anemia    BRCA negative 02/2017   MyRisk neg; IBIS=18% per my calculation; BRCA gene pos in family, pt is neg.   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) DNA test positive    COPD (chronic obstructive  pulmonary disease) (HCC)    Diabetes mellitus without complication (HCC)    Family history of BRCA gene mutation    mat side   Family history of breast cancer    Family history of ovarian cancer    Fatty liver    Genital warts    Hidradenitis suppurativa    LGSIL on Pap smear of cervix 2014   Neuropathy    Obesity    Sleep apnea     Family History: Non contributory to the present illness  Social History: Social History   Socioeconomic History   Marital status: Single    Spouse name: Not on file   Number of children: Not on file   Years of education: Not on file   Highest education level: Not on file  Occupational History   Not on file  Tobacco Use   Smoking status: Every Day    Current packs/day: 0.50    Types: Cigarettes   Smokeless tobacco: Never  Vaping Use   Vaping status: Former  Substance and Sexual Activity   Alcohol use: Yes    Comment: occassional   Drug use: Yes    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 rate 16, height 5\' 6"  (1.676 m), weight (!) 372 lb 4.8 oz (168.9 kg), SpO2 98%. Body mass index is 60.09 kg/m.    Examination: General Appearance: The patient is well-developed, well-nourished, and in no distress. Neck Circumference: 44cm Skin: Gross inspection of skin unremarkable. Head: normocephalic, no gross deformities. Eyes: no gross deformities noted. ENT: ears appear grossly normal Neurologic: Alert and oriented. No involuntary movements.  STOP BANG RISK  ASSESSMENT S (snore) Have you been told that you snore?     N0   T (tired) Are you often tired, fatigued, or sleepy during the day?   NO  O (obstruction) Do you stop breathing, choke, or gasp during sleep? NO   P (pressure) Do you have or are you being treated for high blood pressure? YES   B (BMI) Is your body index greater than 35 kg/m? YES   A (age) Are you 47 years old or older? NO   N (neck) Do you have a neck circumference greater than 16 inches?   YES   G (gender) Are you a female? NO   TOTAL STOP/BANG "YES" ANSWERS 3       A STOP-Bang score of 2 or less is considered low risk, and a score of  5 or more is high risk for having either moderate or severe OSA. For people who score 3 or 4, doctors may need to perform further assessment to determine how likely they are to have OSA.         EPWORTH SLEEPINESS SCALE:  Scale:  (0)= no chance of dozing; (1)= slight chance of dozing; (2)= moderate chance of dozing; (3)= high chance of dozing  Chance  Situtation    Sitting and reading: 1    Watching TV: 1    Sitting Inactive in public: 1    As a passenger in car: 1      Lying down to rest: 2    Sitting and talking: 0    Sitting quielty after lunch: 1    In a car, stopped in traffic: 0   TOTAL SCORE:   7 out of 24    SLEEP STUDIES:  Split - 06/30/2010 - AHI 16.6/hr  min Sp02 84%  CPAP @14cmH20  Split - 01/27/2017 - AHI 70.7/hr  min Sp02 79%  CPAP @17cmH20    CPAP COMPLIANCE DATA:  Date Range: 05/24/2023-2-16.2025  Average Daily Use: 10.5 hours  Median Use: 10 hrs  Compliance for > 4 Hours: 100% days  AHI: 0.0 respiratory events per hour  Days Used: 90/90  Mask Leak: 5.4  95th Percentile Pressure: 17        LABS: No results found for this or any previous visit (from the past 2160 hours).  Radiology: CT Maxillofacial W Contrast Result Date: 03/05/2023 CLINICAL DATA:  Initial evaluation for acute left facial swelling, infection. EXAM: CT  MAXILLOFACIAL WITH CONTRAST TECHNIQUE: Multidetector CT imaging of the maxillofacial structures was performed with intravenous contrast. Multiplanar CT image reconstructions were also generated. RADIATION DOSE REDUCTION: This exam was performed according to the departmental dose-optimization program which includes automated exposure control, adjustment of the mA and/or kV according to patient size and/or use of iterative reconstruction technique. CONTRAST:  75mL OMNIPAQUE IOHEXOL 300 MG/ML  SOLN COMPARISON:  None Available. FINDINGS: Osseous: No acute osseous abnormality. Orbits: Globes and orbital soft tissues within normal limits. No evidence for intraorbital or postseptal cellulitis. Sinuses: Paranasal sinuses are largely clear. Mastoid air cells are clear. Soft tissues: Soft tissue swelling with hazy inflammatory stranding seen involving the left infraorbital soft tissues, suspicious for infection/cellulitis. 4 mm hypodensity just deep to the skin in this region suspicious for a tiny abscess (series 2, image 57). No other drainable fluid collection. No postseptal or intraorbital extension of infection at this time. Limited intracranial: Unremarkable. IMPRESSION: Soft tissue swelling with hazy inflammatory stranding involving the left infraorbital soft tissues, suspicious for infection/cellulitis. 4 mm hypodensity just deep to the skin in this region suspicious for a tiny abscess. No postseptal or intraorbital extension of infection at this time. Electronically Signed   By: Rise Mu M.D.   On: 03/05/2023 23:00    No results found.  No results found.    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) 03/11/2023   Opioid dependence, uncomplicated (HCC) 12/13/2019   Bipolar disorder, unspecified (HCC) 08/10/2019   Generalized anxiety disorder 05/30/2019   Major depressive disorder, single episode, mild (HCC) 05/30/2019   Hidradenitis suppurativa 05/25/2019   Morbid (  severe) obesity due to excess calories (HCC) 05/04/2019   Genital warts 03/23/2018   BRCA negative 04/25/2017   Chronic lower extremity pain (Secondary Area of Pain) (Bilateral) (R>L) 02/09/2017   Chronic knee pain [facility] Area of Pain) (Bilateral) (R>L) 02/09/2017   Chronic hip pain (Fourth Area of Pain) (Bilateral) (R>L) 02/09/2017   Chronic shoulder pain (Fifth Area of Pain) (Bilateral) (R>L) 02/09/2017   Chronic neck pain (Bilateral) (R>L) 02/09/2017   NSAID long-term use 02/09/2017   Osteoarthritis of knee 02/08/2017   Chronic pain syndrome 02/08/2017   Opiate use 02/08/2017    Class: History of   Lumbar paracentral disc bulge at L2-3 (Left) 02/08/2017   Lumbar facet hypertrophy (Bilateral) 02/08/2017   Chronic lumbar radiculitis of L3 (Left) 02/08/2017   Chronic low back pain (Primary Area of Pain) (Bilateral) (L>R) 02/08/2017   Morbid obesity with BMI of 50.0-59.9, adult (HCC) 02/08/2017   Family history of breast cancer 02/01/2017   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) DNA test positive 02/01/2017   Family history of BRCA gene positive 02/01/2017   Dyspepsia 03/26/2014   IBS (irritable bowel syndrome) 03/26/2014    1. OSA (obstructive sleep apnea) (Primary)  The patient does tolerate PAP and reports  benefit from PAP use. Her apnea is controlled. Her machine is out of warranty, obsolete for repair, and needs replacement.The patient was reminded how to clean equipment and advised to replace supplies routinely. The patient was also counselled on weigh tloss. The compliance is excellent. The AHI is 0.0.   OSA on cpap- controlled. Replace machine.  Continue with excellent compliance  with pap. CPAP continues to be medically necessary to treat this patient's OSA. F/u after setup.  2. CPAP use counseling CPAP Counseling: had a lengthy discussion with the patient regarding the importance of PAP therapy in management of the sleep apnea. Patient appears to understand the risk factor reduction and also understands the risks associated with untreated sleep apnea. Patient will try to make a good faith effort to remain compliant with therapy. Also instructed the patient on proper cleaning of the device including the water must be changed daily if possible and use of distilled water is preferred. Patient understands that the machine should be regularly cleaned with appropriate recommended cleaning solutions that do not damage the PAP machine for example given white vinegar and water rinses. Other methods such as ozone treatment may not be as good as these simple methods to achieve cleaning.   3. Morbid obesity (HCC) Obesity Counseling: Had a lengthy discussion regarding patients BMI and weight issues. Patient was instructed on portion control as well as increased activity. Also discussed caloric restrictions with trying to maintain intake less than 2000 Kcal. Discussions were made in accordance with the 5As of weight management. Simple actions such as not eating late and if able to, taking a walk is suggested.    General Counseling: I have discussed the findings of the evaluation and examination with [facility].  I have also discussed any further diagnostic evaluation thatmay be needed or ordered today. Teondra verbalizes understanding of the findings of todays visit. We also reviewed her medications today and discussed drug interactions and side effects including but not limited excessive drowsiness and altered mental states. We also discussed that there is always a risk not just to her but also people around her. she has been encouraged to call the office with any questions or concerns that should  arise related to todays visit.  No orders of the defined  types were placed in this encounter.       I have personally obtained a history, examined the patient, evaluated laboratory and imaging results, formulated the assessment and plan and placed orders. This patient was seen today by Emmaline Kluver, PA-C in collaboration with Dr. Freda Munro.   Yevonne Pax, MD [facility] Diplomate ABMS Pulmonary Critical Care Medicine and Sleep Medicine

## 2023-08-22 ENCOUNTER — Ambulatory Visit (INDEPENDENT_AMBULATORY_CARE_PROVIDER_SITE_OTHER): Payer: MEDICAID | Admitting: Internal Medicine

## 2023-08-22 VITALS — BP 189/85 | HR 87 | Resp 16 | Ht 66.0 in | Wt 372.3 lb

## 2023-08-22 DIAGNOSIS — Z7189 Other specified counseling: Secondary | ICD-10-CM | POA: Diagnosis not present

## 2023-08-22 DIAGNOSIS — G4733 Obstructive sleep apnea (adult) (pediatric): Secondary | ICD-10-CM | POA: Insufficient documentation

## 2023-08-22 NOTE — Patient Instructions (Signed)

## 2023-09-04 NOTE — Progress Notes (Deleted)
 Garnet Koyanagi, FNP   No chief complaint on file.   HPI:      [name] is a 48 y.o. [id number] whose LMP was No LMP recorded., presents today for ***  Neg mammo 9/24 at [facility]  Patient Active Problem List   Diagnosis Date Noted   OSA (obstructive sleep apnea) 08/22/2023   CPAP use counseling 08/22/2023   Morbid obesity (HCC) 08/22/2023   Essential (primary) hypertension 03/11/2023   Obstructive sleep apnea (adult) (pediatric) 03/11/2023   Type 2 diabetes mellitus with diabetic neuropathic arthropathy (HCC) 03/11/2023   Opioid dependence, uncomplicated (HCC) 12/13/2019   Bipolar disorder, unspecified (HCC) 08/10/2019   Generalized anxiety disorder 05/30/2019   Major depressive disorder, single episode, mild (HCC) 05/30/2019   Hidradenitis suppurativa 05/25/2019   Morbid (severe) obesity due to excess calories (HCC) 05/04/2019   Genital warts 03/23/2018   BRCA negative 04/25/2017   Chronic lower extremity pain (Secondary Area of Pain) (Bilateral) (R>L) 02/09/2017   Chronic knee pain (Tertiary Area of Pain) (Bilateral) (R>L) 02/09/2017   Chronic hip pain (Fourth Area of Pain) (Bilateral) (R>L) 02/09/2017   Chronic shoulder pain (Fifth Area of Pain) (Bilateral) (R>L) 02/09/2017   Chronic neck pain (Bilateral) (R>L) 02/09/2017   NSAID long-term use 02/09/2017   Osteoarthritis of knee 02/08/2017   Chronic pain syndrome 02/08/2017   Opiate use 02/08/2017   Lumbar paracentral disc bulge at L2-3 (Left) 02/08/2017   Lumbar facet hypertrophy (Bilateral) 02/08/2017   Chronic lumbar radiculitis of L3 (Left) 02/08/2017   Chronic low back pain (Primary Area of Pain) (Bilateral) (L>R) 02/08/2017   Morbid obesity with BMI of 50.0-59.9, adult (HCC) 02/08/2017   Family history of breast cancer 02/01/2017   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) DNA test positive 02/01/2017   Family history of BRCA gene positive 02/01/2017   Dyspepsia 03/26/2014   IBS (irritable bowel  syndrome) 03/26/2014    Past Surgical History:  Procedure Laterality Date   CHOLECYSTECTOMY     CONDYLOMA EXCISION/FULGURATION Right 08/12/2021   Procedure: CONDYLOMA REMOVAL;  Surgeon: Campbell Lerner, MD;  Location: [facility] ORS;  Service: General;  Laterality: Right;   GALLBLADDER SURGERY      Family History  Problem Relation Age of Onset   Breast cancer Mother 28   Diabetes Mother    Breast cancer Sister 57   Breast cancer Maternal Aunt 40   Ovarian cancer Maternal Aunt 50   Breast cancer Other 16       BRCA pos (daughter of affected aunt with breast cancer)   Diabetes Father    Kidney disease Father     Social History   Socioeconomic History   Marital status: Single    Spouse name: Not on file   Number of children: Not on file   Years of education: Not on file   Highest education level: Not on file  Occupational History   Not on file  Tobacco Use   Smoking status: Every Day    Current packs/day: 0.50    Types: Cigarettes   Smokeless tobacco: Never  Vaping Use   Vaping status: Former  Substance and Sexual Activity   Alcohol use: Yes    Comment: occassional   Drug use: 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     albuterol (PROVENTIL HFA;VENTOLIN HFA) 108 (90 Base) MCG/ACT inhaler Inhale 2 puffs into the lungs every 6 (six) hours as needed  for wheezing or shortness of breath.     Buprenorphine HCl-Naloxone HCl 8-2 MG FILM Place 0.5 Film under the tongue in the morning, at noon, in the evening, and at bedtime.     cyclobenzaprine (FLEXERIL) 10 MG tablet Take 10 mg by mouth at bedtime.     doxycycline (VIBRAMYCIN) 100 MG capsule Take 100 mg by mouth daily.     FLUoxetine (PROZAC) 20 MG capsule Take 20 mg by mouth daily.     gabapentin (NEURONTIN) 300 MG capsule Take 300 mg by mouth daily as needed (pain).     HUMIRA, 2 PEN, 40 MG/0.4ML pen SMARTSIG:40 Milligram(s) SUB-Q Once a Week     lisinopril-hydrochlorothiazide (PRINZIDE,ZESTORETIC) 10-12.5 MG tablet Take 1 tablet by mouth daily.     metFORMIN (GLUCOPHAGE) 500 MG tablet Take 1,000 mg by mouth 2 (two) times daily.     ondansetron (ZOFRAN-ODT) 4 MG disintegrating tablet Take 1 tablet (4 mg total) by mouth every 8 (eight) hours as needed. 20 tablet 0   OZEMPIC, 1 MG/DOSE, 4 MG/3ML SOPN      rOPINIRole (REQUIP) 0.5 MG tablet Take 0.5 mg by mouth at bedtime.     No facility-administered medications prior to visit.      ROS:  Review of Systems BREAST: No symptoms   OBJECTIVE:   Vitals:  There were no vitals taken for this visit.  Physical Exam  Results: No results found for this or any previous visit (from the past 24 hours).   Assessment/Plan: No diagnosis found.    No orders of the defined types were placed in this encounter.     No follow-ups on file.  Zackary Mckeone B. Ellyse Rotolo, PA-C 09/04/2023 10:25 PM

## 2023-09-06 ENCOUNTER — Ambulatory Visit: Payer: MEDICAID | Admitting: Obstetrics and Gynecology

## 2023-10-12 NOTE — Progress Notes (Unsigned)
 Garnet Koyanagi, FNP   No chief complaint on file.   HPI:      [name] is a 48 y.o. [id number] whose LMP was No LMP recorded., presents today for *** Neg mammo 9/24 at [facility]   Patient Active Problem List   Diagnosis Date Noted   OSA (obstructive sleep apnea) 08/22/2023   CPAP use counseling 08/22/2023   Morbid obesity (HCC) 08/22/2023   Essential (primary) hypertension 03/11/2023   Obstructive sleep apnea (adult) (pediatric) 03/11/2023   Type 2 diabetes mellitus with diabetic neuropathic arthropathy (HCC) 03/11/2023   Opioid dependence, uncomplicated (HCC) 12/13/2019   Bipolar disorder, unspecified (HCC) 08/10/2019   Generalized anxiety disorder 05/30/2019   Major depressive disorder, single episode, mild (HCC) 05/30/2019   Hidradenitis suppurativa 05/25/2019   Morbid (severe) obesity due to excess calories (HCC) 05/04/2019   Genital warts 03/23/2018   BRCA negative 04/25/2017   Chronic lower extremity pain (Secondary Area of Pain) (Bilateral) (R>L) 02/09/2017   Chronic knee pain (Tertiary Area of Pain) (Bilateral) (R>L) 02/09/2017   Chronic hip pain (Fourth Area of Pain) (Bilateral) (R>L) 02/09/2017   Chronic shoulder pain (Fifth Area of Pain) (Bilateral) (R>L) 02/09/2017   Chronic neck pain (Bilateral) (R>L) 02/09/2017   NSAID long-term use 02/09/2017   Osteoarthritis of knee 02/08/2017   Chronic pain syndrome 02/08/2017   Opiate use 02/08/2017   Lumbar paracentral disc bulge at L2-3 (Left) 02/08/2017   Lumbar facet hypertrophy (Bilateral) 02/08/2017   Chronic lumbar radiculitis of L3 (Left) 02/08/2017   Chronic low back pain (Primary Area of Pain) (Bilateral) (L>R) 02/08/2017   Morbid obesity with BMI of 50.0-59.9, adult (HCC) 02/08/2017   Family history of breast cancer 02/01/2017   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) DNA test positive 02/01/2017   Family history of BRCA gene positive 02/01/2017   Dyspepsia 03/26/2014   IBS (irritable bowel  syndrome) 03/26/2014    Past Surgical History:  Procedure Laterality Date   CHOLECYSTECTOMY     CONDYLOMA EXCISION/FULGURATION Right 08/12/2021   Procedure: CONDYLOMA REMOVAL;  Surgeon: Campbell Lerner, MD;  Location: [facility] ORS;  Service: General;  Laterality: Right;   GALLBLADDER SURGERY      Family History  Problem Relation Age of Onset   Breast cancer Mother 24   Diabetes Mother    Breast cancer Sister 88   Breast cancer Maternal Aunt 40   Ovarian cancer Maternal Aunt 50   Breast cancer Other 41       BRCA pos (daughter of affected aunt with breast cancer)   Diabetes Father    Kidney disease Father     Social History   Socioeconomic History   Marital status: Single    Spouse name: Not on file   Number of children: Not on file   Years of education: Not on file   Highest education level: Not on file  Occupational History   Not on file  Tobacco Use   Smoking status: Every Day    Current packs/day: 0.50    Types: Cigarettes   Smokeless tobacco: Never  Vaping Use   Vaping status: Former  Substance and Sexual Activity   Alcohol use: Yes    Comment: occassional   Drug use: 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     albuterol (PROVENTIL HFA;VENTOLIN HFA) 108 (90 Base) MCG/ACT inhaler Inhale 2 puffs into the lungs every 6 (six) hours as needed  for wheezing or shortness of breath.     Buprenorphine HCl-Naloxone HCl 8-2 MG FILM Place 0.5 Film under the tongue in the morning, at noon, in the evening, and at bedtime.     cyclobenzaprine (FLEXERIL) 10 MG tablet Take 10 mg by mouth at bedtime.     doxycycline (VIBRAMYCIN) 100 MG capsule Take 100 mg by mouth daily.     FLUoxetine (PROZAC) 20 MG capsule Take 20 mg by mouth daily.     gabapentin (NEURONTIN) 300 MG capsule Take 300 mg by mouth daily as needed (pain).     HUMIRA, 2 PEN, 40 MG/0.4ML pen SMARTSIG:40 Milligram(s) SUB-Q Once a Week     lisinopril-hydrochlorothiazide (PRINZIDE,ZESTORETIC) 10-12.5 MG tablet Take 1 tablet by mouth daily.     metFORMIN (GLUCOPHAGE) 500 MG tablet Take 1,000 mg by mouth 2 (two) times daily.     ondansetron (ZOFRAN-ODT) 4 MG disintegrating tablet Take 1 tablet (4 mg total) by mouth every 8 (eight) hours as needed. 20 tablet 0   OZEMPIC, 1 MG/DOSE, 4 MG/3ML SOPN      rOPINIRole (REQUIP) 0.5 MG tablet Take 0.5 mg by mouth at bedtime.     No facility-administered medications prior to visit.      ROS:  Review of Systems BREAST: No symptoms   OBJECTIVE:   Vitals:  There were no vitals taken for this visit.  Physical Exam  Results: No results found for this or any previous visit (from the past 24 hours).   Assessment/Plan: No diagnosis found.    No orders of the defined types were placed in this encounter.     No follow-ups on file.  Vahe Pienta B. Bobbijo Holst, PA-C 10/12/2023 5:49 PM

## 2023-10-13 ENCOUNTER — Ambulatory Visit: Payer: MEDICAID | Admitting: Obstetrics and Gynecology

## 2023-10-13 ENCOUNTER — Telehealth: Payer: Self-pay | Admitting: Obstetrics and Gynecology

## 2023-10-13 DIAGNOSIS — N939 Abnormal uterine and vaginal bleeding, unspecified: Secondary | ICD-10-CM

## 2023-10-13 NOTE — Telephone Encounter (Signed)
 Pt was too late for her appt today but was here for her daugther's appt so briefly discussed her concerns. Has had AUB recently. 1 period lasted 16 days and had 2 periods last months. Occas skips periods but menses usually every 28-30 days, lasting 6 days, mod flow.  Dysmenorrhea mild, occurring first 1-2 days of flow, improved with NSAIDs. Occas with amenorrhea. Will check Gyn u/s and then f/u with results and mgmt. Last annual 9/24.

## 2023-10-24 ENCOUNTER — Ambulatory Visit: Payer: MEDICAID

## 2023-10-27 ENCOUNTER — Encounter: Payer: Self-pay | Admitting: Obstetrics and Gynecology

## 2023-11-01 ENCOUNTER — Ambulatory Visit: Payer: MEDICAID

## 2023-11-10 ENCOUNTER — Encounter: Payer: Self-pay | Admitting: Obstetrics and Gynecology

## 2023-11-10 ENCOUNTER — Ambulatory Visit
Admission: RE | Admit: 2023-11-10 | Discharge: 2023-11-10 | Disposition: A | Discharge status: Home / Self Care | Payer: MEDICAID | Source: Ambulatory Visit | Attending: Obstetrics and Gynecology | Admitting: Obstetrics and Gynecology

## 2023-11-10 DIAGNOSIS — Z113 Encounter for screening for infections with a predominantly sexual mode of transmission: Secondary | ICD-10-CM

## 2023-11-10 DIAGNOSIS — N939 Abnormal uterine and vaginal bleeding, unspecified: Secondary | ICD-10-CM | POA: Insufficient documentation

## 2023-11-16 ENCOUNTER — Ambulatory Visit: Payer: MEDICAID

## 2023-11-18 ENCOUNTER — Ambulatory Visit (INDEPENDENT_AMBULATORY_CARE_PROVIDER_SITE_OTHER): Payer: MEDICAID

## 2023-11-18 ENCOUNTER — Other Ambulatory Visit (HOSPITAL_COMMUNITY)
Admission: RE | Admit: 2023-11-18 | Discharge: 2023-11-18 | Disposition: A | Discharge status: Home / Self Care | Payer: MEDICAID | Source: Ambulatory Visit | Attending: Certified Nurse Midwife | Admitting: Certified Nurse Midwife

## 2023-11-18 VITALS — BP 168/88 | HR 91 | Ht 66.0 in | Wt 380.1 lb

## 2023-11-18 DIAGNOSIS — Z113 Encounter for screening for infections with a predominantly sexual mode of transmission: Secondary | ICD-10-CM | POA: Diagnosis present

## 2023-11-18 NOTE — Progress Notes (Signed)
      NURSE VISIT NOTE  Subjective:    Patient ID: FRANN SLUTZ, female    DOB: 1976/04/22, 48 y.o.   MRN: [id number]  HPI  Patient is a 48 y.o. G12P2012 female who presents for STD testing via self swab culture and blood work per [employer], [place]. She states hx of STDS but just ruling out today due to broken condom. Symptoms include increased vaginal discharge starting 4 days ago. Self swab culture preformed. All questions asked, patient aware office will be in touch with results  Objective:    BP (!) 168/88   Pulse 91   Ht 5\' 6"  (1.676 m)   Wt (!) 380 lb 1.6 oz (172.4 kg)   LMP 10/21/2023 (Approximate)   BMI 61.35 kg/m   Report she has not taken her BP medication.   Assessment:   1. Screening for STD (sexually transmitted disease)       Plan:   GC and chlamydia DNA  probe sent to lab. Treatment: await for results for further treatment.  ROV prn if symptoms persist or worsen.   Vale Garrison, CMA

## 2023-11-19 LAB — HIV ANTIBODY (ROUTINE TESTING W REFLEX): HIV Screen 4th Generation wRfx: NONREACTIVE

## 2023-11-19 LAB — RPR: RPR Ser Ql: NONREACTIVE

## 2023-11-19 LAB — HEPATITIS C ANTIBODY: Hep C Virus Ab: NONREACTIVE

## 2023-11-22 DIAGNOSIS — B9689 Other specified bacterial agents as the cause of diseases classified elsewhere: Secondary | ICD-10-CM

## 2023-11-22 DIAGNOSIS — A599 Trichomoniasis, unspecified: Secondary | ICD-10-CM

## 2023-11-22 LAB — CERVICOVAGINAL ANCILLARY ONLY
Bacterial Vaginitis (gardnerella): POSITIVE — AB
Candida Glabrata: NEGATIVE
Candida Vaginitis: NEGATIVE
Chlamydia: NEGATIVE
Comment: NEGATIVE
Comment: NEGATIVE
Comment: NEGATIVE
Comment: NEGATIVE
Comment: NEGATIVE
Comment: NORMAL
Neisseria Gonorrhea: NEGATIVE
Trichomonas: POSITIVE — AB

## 2023-11-22 MED ORDER — METRONIDAZOLE 500 MG PO TABS: 500.0000 mg | ORAL_TABLET | Freq: Two times a day (BID) | ORAL | 0 refills | Status: DC

## 2023-11-23 ENCOUNTER — Ambulatory Visit: Payer: Self-pay

## 2023-11-23 ENCOUNTER — Other Ambulatory Visit: Payer: Self-pay

## 2023-11-23 NOTE — Progress Notes (Signed)
Encounter opened in error. KW 

## 2024-01-02 ENCOUNTER — Ambulatory Visit: Payer: MEDICAID

## 2024-01-02 DIAGNOSIS — A599 Trichomoniasis, unspecified: Secondary | ICD-10-CM

## 2024-01-02 DIAGNOSIS — B9689 Other specified bacterial agents as the cause of diseases classified elsewhere: Secondary | ICD-10-CM

## 2024-01-03 ENCOUNTER — Ambulatory Visit: Payer: MEDICAID

## 2024-01-05 ENCOUNTER — Ambulatory Visit: Payer: MEDICAID

## 2024-01-05 NOTE — Progress Notes (Deleted)
    NURSE VISIT NOTE  Subjective:    Patient ID: ELIANIE HUBERS, female    DOB: 10-14-1975, 48 y.o.   MRN: [id number]  HPI  Patient is a 48 y.o. G77P2012 female who presents for {pe vag discharge desc:315065} vaginal discharge for *** {gen duration:315003}. Denies abnormal vaginal bleeding or significant pelvic pain or fever. {Actions; denies/reports/admits to:19208} {UTI Symptoms:210800002}. Patient {has/denies:315300} history of known exposure to STD.   Objective:    There were no vitals taken for this visit.   @THIS  VISIT ONLY@  Assessment:   No diagnosis found.  {vaginitis type:315262}  Plan:   GC and chlamydia DNA  probe sent to lab. Treatment: {vaginitis tx:315263} ROV prn if symptoms persist or worsen.   Harlene Gander, CMA

## 2024-01-09 NOTE — Patient Instructions (Incomplete)
 Preventing Sexually Transmitted Infections, Adult Sexually transmitted infections (STIs) are spread from person to person (are contagious). They are spread, or transmitted, during sex. The sex may be vaginal, anal, or oral. STIs can be passed during sexual contact with skin, genitals, mouth, or rectum. They may spread through body fluids, such as saliva, semen, blood, vaginal mucus, and urine. STIs are very common. They can happen in people of all ages. Some common STIs are: Herpes. Hepatitis B. Chlamydia. Gonorrhea. Syphilis. Trichomoniasis. Human papillomavirus (HPV). Human immunodeficiency virus (HIV). This can cause acquired immunodeficiency syndrome (AIDS). How can STIs affect me? You may not have symptoms with an STI. Even if you do not have symptoms, you can still spread the infection to others. You also still need treatment. STIs can be treated. Some STIs can be cured. Other STIs cannot be cured and will affect you for the rest of your life. Certain STIs may: Require you to take medicine for the rest of your life. Affect your ability to have children. Increase your risk for getting other STIs. Increase your risk of getting certain conditions. These may include: Cervical cancer. Pelvic inflammatory disease (PID). Organ damage or damage to other parts of your body. This can happen if the infection spreads. Cause problems during pregnancy. STIs may be spread to the baby during pregnancy or birth. Females tend to have more severe problems from STIs than males. What can increase my risk? You may be more at risk for an STI if: You do not use protection during sex. You have more than one sex partner. You have a sex partner who has other sex partners. You have sex with a person who has an STI. You have an STI, or you have had an STI before. You inject drugs or have a sex partner who injects drugs. What actions can I take to prevent STIs? The only way to fully prevent STIs is not to  have sex of any kind. This is called practicing abstinence. If you are sexually active, you can protect yourself and others by taking these actions to lower your risk of getting an STI: Lifestyle Have only one sex partner or limit the number of sex partners you have. Avoid having sex after you have alcohol or drugs. Alcohol and drugs can affect your ability to make good choices. This can lead to risky sexual behaviors. Go to prevention counseling. This can teach you how to avoid getting an STI. Barrier protection  Use methods to stop body fluids from being exchanged between partners during sex (barrier protection). These methods can be used during oral, vaginal, or anal sex. They include: External condom, for males. Internal condom, for females. Dental dam. Use a new barrier method for every sex act from start to finish. Know that a barrier method may not protect you from all STIs. Some STIs, such as herpes, are spread through skin-to-skin contact. Avoid all sexual contact if you or a partner has herpes and there is an active flare with open sores. Birth control pills, injections, implants, and intrauterine devices (IUDs) do not protect against STIs. To prevent both STIs and pregnancy, always use a condom with a second form of birth control. General information Ask your health care provider about taking pre-exposure prophylaxis (PrEP) to prevent HIV. Stay up to date on your vaccines. Some vaccines can lower your risk of getting certain STIs. These include: Hepatitis B vaccine. HPV vaccine. This is recommended for people up to age 66. Get tested for STIs. Have your  partners get tested, too. If you test positive for an STI, follow recommendations from your health care provider about treatment. Make sure your sex partners are tested and treated as well. Where to find more information Learn more about STIs from: Centers for Disease Control and Prevention (CDC): More information about certain  STIs: TonerPromos.no Places to get sexual health counseling and treatment for free or at a low cost: gettested.TonerPromos.no U.S. Department of Health and Human Services [facility]): TravelLesson.ca This information is not intended to replace advice given to you by your health care provider. Make sure you discuss any questions you have with your health care provider. Document Revised: 07/01/2022 Document Reviewed: 12/04/2021 Elsevier Patient Education  2024 [employer].

## 2024-01-10 ENCOUNTER — Ambulatory Visit: Payer: MEDICAID

## 2024-01-17 ENCOUNTER — Ambulatory Visit: Payer: MEDICAID

## 2024-01-27 ENCOUNTER — Ambulatory Visit: Payer: MEDICAID

## 2024-01-27 DIAGNOSIS — Z113 Encounter for screening for infections with a predominantly sexual mode of transmission: Secondary | ICD-10-CM

## 2024-02-06 ENCOUNTER — Other Ambulatory Visit (HOSPITAL_COMMUNITY)
Admission: RE | Admit: 2024-02-06 | Discharge: 2024-02-06 | Disposition: A | Discharge status: Home / Self Care | Payer: MEDICAID | Source: Ambulatory Visit | Attending: Certified Nurse Midwife | Admitting: Certified Nurse Midwife

## 2024-02-06 ENCOUNTER — Ambulatory Visit: Payer: MEDICAID

## 2024-02-06 VITALS — BP 119/80 | HR 103 | Wt 355.0 lb

## 2024-02-06 DIAGNOSIS — Z113 Encounter for screening for infections with a predominantly sexual mode of transmission: Secondary | ICD-10-CM | POA: Diagnosis present

## 2024-02-06 DIAGNOSIS — R350 Frequency of micturition: Secondary | ICD-10-CM

## 2024-02-06 LAB — POCT URINALYSIS DIPSTICK
Bilirubin, UA: NEGATIVE
Blood, UA: NEGATIVE
Glucose, UA: NEGATIVE
Ketones, UA: POSITIVE
Nitrite, UA: NEGATIVE
Protein, UA: POSITIVE — AB
Spec Grav, UA: 1.025 (ref 1.010–1.025)
Urobilinogen, UA: 0.2 U/dL
pH, UA: 7 (ref 5.0–8.0)

## 2024-02-06 NOTE — Progress Notes (Signed)
    NURSE VISIT NOTE  Subjective:    Patient ID: Amy Cisneros, female    DOB: 1975/10/23, 48 y.o.   MRN: [id number]  HPI  Patient is a 48 y.o. [id number] female who presents for a test of cure. Patient also requested a urine collection due to having some frequent urine along with some pressure.    Objective:    BP 119/80 (BP Location: Right Wrist, Patient Position: Sitting, Cuff Size: Normal)   Pulse (!) 103   Wt (!) 355 lb (161 kg)   BMI 57.30 kg/m     Assessment:   1. Frequent urination       Plan:   GC and chlamydia DNA  probe sent to lab. Treatment: waiting on results ROV prn if symptoms persist or worsen.   Burnard LITTIE Ro, CMA

## 2024-02-08 ENCOUNTER — Other Ambulatory Visit: Payer: Self-pay

## 2024-02-08 DIAGNOSIS — B9689 Other specified bacterial agents as the cause of diseases classified elsewhere: Secondary | ICD-10-CM

## 2024-02-08 DIAGNOSIS — A599 Trichomoniasis, unspecified: Secondary | ICD-10-CM

## 2024-02-08 LAB — CERVICOVAGINAL ANCILLARY ONLY
Bacterial Vaginitis (gardnerella): POSITIVE — AB
Candida Glabrata: NEGATIVE
Candida Vaginitis: POSITIVE — AB
Chlamydia: NEGATIVE
Comment: NEGATIVE
Comment: NEGATIVE
Comment: NEGATIVE
Comment: NEGATIVE
Comment: NEGATIVE
Comment: NORMAL
Neisseria Gonorrhea: NEGATIVE
Trichomonas: POSITIVE — AB

## 2024-02-08 LAB — URINE CULTURE

## 2024-02-08 MED ORDER — METRONIDAZOLE 500 MG PO TABS
ORAL_TABLET | ORAL | 1 refills | Status: DC
Start: 2024-02-08 — End: 2024-03-12

## 2024-02-08 MED ORDER — NITROFURANTOIN MONOHYD MACRO 100 MG PO CAPS: 100.0000 mg | ORAL_CAPSULE | Freq: Two times a day (BID) | ORAL | 0 refills | Status: DC

## 2024-02-08 MED ORDER — FLUCONAZOLE 150 MG PO TABS: 150.0000 mg | ORAL_TABLET | Freq: Once | ORAL | 0 refills | Status: AC

## 2024-02-09 NOTE — Progress Notes (Signed)
 I spoke with patient on 08/07. We discussed results, I sent medication to local pharmacy. I informed patient not to drink alcohol with the metronidazole . I also told patient to follow up in 4 weeks for TOC. She verbalized understanding.

## 2024-02-17 NOTE — Progress Notes (Signed)
 No show

## 2024-02-20 ENCOUNTER — Other Ambulatory Visit: Payer: Self-pay

## 2024-02-20 ENCOUNTER — Ambulatory Visit: Payer: MEDICAID | Admitting: Internal Medicine

## 2024-02-20 DIAGNOSIS — N898 Other specified noninflammatory disorders of vagina: Secondary | ICD-10-CM

## 2024-02-20 MED ORDER — FLUCONAZOLE 150 MG PO TABS: 150.0000 mg | ORAL_TABLET | Freq: Once | ORAL | 0 refills | Status: DC

## 2024-02-22 ENCOUNTER — Other Ambulatory Visit: Payer: Self-pay

## 2024-02-22 ENCOUNTER — Telehealth: Payer: Self-pay

## 2024-02-22 DIAGNOSIS — N898 Other specified noninflammatory disorders of vagina: Secondary | ICD-10-CM

## 2024-02-22 MED ORDER — FLUCONAZOLE 150 MG PO TABS
150.0000 mg | ORAL_TABLET | Freq: Once | ORAL | 0 refills | Status: AC
Start: 2024-02-22 — End: 2024-02-22

## 2024-02-22 NOTE — Telephone Encounter (Signed)
 Pt last swab on 02/06/24 stated she is still having itching was taking the medication for bv and yeast and had yast as well stated she has not gotten rid of the itching, I let her know I would refil but she would need apt if she stil has the SX as she stated she is also diabetic and that sometimes has a hard time getting rid of the yeast, I let her know about over the counter medication she states rx is cheaper for her as ins covers.

## 2024-03-09 NOTE — Progress Notes (Signed)
 [facility] [address] [place], [place] [address]  Pulmonary Sleep Medicine   Office Visit Note  Patient Name: Amy Cisneros DOB: 09-27-1975 MRN [id number]    Chief Complaint: Obstructive Sleep Apnea visit  Brief History:  Debbera is seen today for a follow up visit for CPAP@ 17 cmH2O. The patient has a 13 year history of sleep apnea. Patient is using PAP nightly.  The patient feels rested after sleeping with PAP.  The patient reports benefiting from PAP use. Reported sleepiness is  improved and the Epworth Sleepiness Score is 12 out of 24. The patient does take naps daily. The patient complains of the following: none.  The compliance download shows 98% compliance with an average use time of 10 hours 14 minutes. The AHI is 0.1.  The patient does complain of limb movements disrupting sleep but when she takes her ropinirole it is effective. The patient continues to require PAP therapy in order to eliminate sleep apnea.   ROS  General: (-) fever, (-) chills, (-) night sweat Nose and Sinuses: (-) nasal stuffiness or itchiness, (-) postnasal drip, (-) nosebleeds, (-) sinus trouble. Mouth and Throat: (-) sore throat, (-) hoarseness. Neck: (-) swollen glands, (-) enlarged thyroid, (-) neck pain. Respiratory: +cough, - shortness of breath, - wheezing. Neurologic: - numbness, - tingling. Psychiatric: + anxiety, + depression   Current Medication: Outpatient Encounter Medications as of 03/12/2024  Medication Sig Note   hydrochlorothiazide (HYDRODIURIL) 25 MG tablet Take 25 mg by mouth daily.    lisinopril (ZESTRIL) 10 MG tablet Take 10 mg by mouth daily.    metFORMIN  (GLUCOPHAGE ) 1000 MG tablet Take 1,000 mg by mouth 2 (two) times daily.    OZEMPIC, 2 MG/DOSE, 8 MG/3ML SOPN     albuterol (ACCUNEB) 0.63 MG/3ML nebulizer solution Take 1 ampule by nebulization every 6 (six) hours as needed for wheezing.    albuterol (PROVENTIL HFA;VENTOLIN HFA) 108 (90 Base) MCG/ACT inhaler  Inhale 2 puffs into the lungs every 6 (six) hours as needed for wheezing or shortness of breath.    gabapentin  (NEURONTIN ) 300 MG capsule Take 300 mg by mouth daily as needed (pain).    HUMIRA, 2 PEN, 40 MG/0.4ML pen SMARTSIG:40 Milligram(s) SUB-Q Once a Week    ondansetron  (ZOFRAN -ODT) 4 MG disintegrating tablet Take 1 tablet (4 mg total) by mouth every 8 (eight) hours as needed.    rOPINIRole (REQUIP) 0.5 MG tablet Take 0.5 mg by mouth at bedtime.    [DISCONTINUED] Buprenorphine HCl-Naloxone HCl 8-2 MG FILM Place 0.5 Film under the tongue in the morning, at noon, in the evening, and at bedtime. (Patient not taking: Reported on 47/10/2023)    [DISCONTINUED] cyclobenzaprine (FLEXERIL) 10 MG tablet Take 10 mg by mouth at bedtime.    [DISCONTINUED] doxycycline (VIBRAMYCIN) 100 MG capsule Take 100 mg by mouth daily.    [DISCONTINUED] FLUoxetine (PROZAC) 20 MG capsule Take 20 mg by mouth daily. (Patient not taking: Reported on 47/10/2023)    [DISCONTINUED] lisinopril-hydrochlorothiazide (PRINZIDE,ZESTORETIC) 10-12.5 MG tablet Take 1 tablet by mouth daily. 07/31/2021: Needs Refilled    [DISCONTINUED] metFORMIN  (GLUCOPHAGE ) 500 MG tablet Take 1,000 mg by mouth 2 (two) times daily.    [DISCONTINUED] metroNIDAZOLE  (FLAGYL ) 500 MG tablet Take 4 tablets at once. Repeat if necessary.    [DISCONTINUED] nitrofurantoin , macrocrystal-monohydrate, (MACROBID ) 100 MG capsule Take 1 capsule (100 mg total) by mouth 2 (two) times daily.    [DISCONTINUED] OZEMPIC, 1 MG/DOSE, 4 MG/3ML SOPN     No facility-administered encounter  medications on file as of 03/12/2024.    Surgical History: Past Surgical History:  Procedure Laterality Date   CHOLECYSTECTOMY     CONDYLOMA EXCISION/FULGURATION Right 08/12/2021   Procedure: CONDYLOMA REMOVAL;  Surgeon: Lane Shope, MD;  Location: [facility] ORS;  Service: General;  Laterality: Right;   GALLBLADDER SURGERY      Medical History: Past Medical History:  Diagnosis Date   Anemia     BRCA negative 02/2017   MyRisk neg; IBIS=18% per my calculation; BRCA gene pos in family, pt is neg.   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) DNA test positive    COPD (chronic obstructive pulmonary disease) (HCC)    Diabetes mellitus without complication (HCC)    Family history of BRCA gene mutation    mat side   Family history of breast cancer    Family history of ovarian cancer    Fatty liver    Genital warts    Hidradenitis suppurativa    LGSIL on Pap smear of cervix 2014   Neuropathy    Obesity    Sleep apnea     Family History: Non contributory to the present illness  Social History: Social History   Socioeconomic History   Marital status: Single    Spouse name: Not on file   Number of children: Not on file   Years of education: Not on file   Highest education level: Not on file  Occupational History   Not on file  Tobacco Use   Smoking status: Every Day    Current packs/day: 0.50    Types: Cigarettes   Smokeless tobacco: Never  Vaping Use   Vaping status: Former  Substance and Sexual Activity   Alcohol use: Yes    Comment: occassional   Drug use: Yes    Types: 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 rate 16, height 5' 6 (1.676 m), weight (!) 360 lb (163.3 kg), SpO2 98%. Body mass index is 58.11 kg/m.    Examination: General Appearance: The patient is well-developed, well-nourished, and in no distress. Neck Circumference: 44 cm Skin: Gross inspection of skin  unremarkable. Head: normocephalic, no gross deformities. Eyes: no gross deformities noted. ENT: ears appear grossly normal Neurologic: Alert and oriented. No involuntary movements.  STOP BANG RISK ASSESSMENT S (snore) Have you been told that you snore?     NO   T (tired) Are you often tired, fatigued, or sleepy during the day?   YES  O (obstruction) Do you stop breathing, choke, or gasp during sleep? NO   P (pressure) Do you have or are you being treated for high blood pressure? YES   B (BMI) Is your body index greater than 35 kg/m? YES   A (age) Are you 30 years old or older? NO   N (neck) Do you have a neck circumference greater than 16 inches?   YES   G (gender) Are you a female? NO   TOTAL STOP/BANG "  YES" ANSWERS 4       A STOP-Bang score of 2 or less is considered low risk, and a score of 5 or more is high risk for having either moderate or severe OSA. For people who score 3 or 4, doctors may need to perform further assessment to determine how likely they are to have OSA.         EPWORTH SLEEPINESS SCALE:  Scale:  (0)= no chance of dozing; (1)= slight chance of dozing; (2)= moderate chance of dozing; (3)= high chance of dozing  Chance  Situtation    Sitting and reading: 1    Watching TV: 1    Sitting Inactive in public: 2    As a passenger in car: 2      Lying down to rest: 3    Sitting and talking: 1    Sitting quielty after lunch: 1    In a car, stopped in traffic: 1   TOTAL SCORE:   12 out of 24    SLEEP STUDIES:  Split (06/2010) AHI 16.6/hr, min SpO2 84%, CPAP@ 14 cmH2O Split (01/2017) AHI 70.7/hr, min SpO2 79%, CPAP@ 17 cmH2O   CPAP COMPLIANCE DATA:  Date Range: 12/08/2023-03/09/2024  Average Daily Use: 10 hours 14 minutes  Median Use: 10 hours 19 minutes  Compliance for > 4 Hours: 98%  AHI: 0.1 respiratory events per hour  Days Used: 93/93 days  Mask Leak: 3.9  95th Percentile Pressure: 17         LABS: Recent Results  (from the past 2160 hours)  Urine Culture     Status: Abnormal   Collection Time: 02/06/24  3:22 AM   Specimen: Urine   UR  Result Value Ref Range   Urine Culture, Routine Final report (A)    Organism ID, Bacteria Comment (A)     Comment: Beta hemolytic Streptococcus, group B 10,000-25,000 colony forming units per mL Penicillin and ampicillin are drugs of choice for treatment of beta-hemolytic streptococcal infections. Susceptibility testing of penicillins and other beta-lactam agents approved by the FDA for treatment of beta-hemolytic streptococcal infections need not be performed routinely because nonsusceptible isolates are extremely rare in any beta-hemolytic streptococcus and have not been reported for Streptococcus pyogenes (group A). (CLSI)   POCT Urinalysis Dipstick     Status: Abnormal   Collection Time: 02/06/24  3:09 PM  Result Value Ref Range   Color, UA     Clarity, UA     Glucose, UA Negative Negative   Bilirubin, UA Negative    Ketones, UA Positive    Spec Grav, UA 1.025 1.010 - 1.025   Blood, UA Negative    pH, UA 7.0 5.0 - 8.0   Protein, UA Positive (A) Negative   Urobilinogen, UA 0.2 0.2 or 1.0 E.U./dL   Nitrite, UA Negative    Leukocytes, UA Moderate (2+) (A) Negative   Appearance     Odor    Cervicovaginal ancillary only     Status: Abnormal   Collection Time: 02/06/24  3:09 PM  Result Value Ref Range   Neisseria Gonorrhea Negative    Chlamydia Negative    Trichomonas Positive (A)    Bacterial Vaginitis (gardnerella) Positive (A)    Candida Vaginitis Positive (A)    Candida Glabrata Negative    Comment Normal Reference Range Candida Species - Negative    Comment Normal Reference Range Candida Galbrata - Negative    Comment Normal Reference Range Trichomonas - Negative    Comment  Normal Reference Range Bacterial Vaginosis - Negative   Comment Normal Reference Ranger Chlamydia - Negative    Comment      Normal Reference Range Neisseria  Gonorrhea - Negative    Radiology: No results found.  No results found.  No results found.    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) 03/11/2023   Opioid dependence, uncomplicated (HCC) 12/13/2019   Bipolar disorder, unspecified (HCC) 08/10/2019   Generalized anxiety disorder 05/30/2019   Major depressive disorder, single episode, mild (HCC) 05/30/2019   Hidradenitis suppurativa 05/25/2019   Morbid (severe) obesity due to excess calories (HCC) 05/04/2019   Genital warts 03/23/2018   BRCA negative 04/25/2017   Chronic lower extremity pain (Secondary Area of Pain) (Bilateral) (R>L) 02/09/2017   Chronic knee pain (Tertiary Area of Pain) (Bilateral) (R>L) 02/09/2017   Chronic hip pain (Fourth Area of Pain) (Bilateral) (R>L) 02/09/2017   Chronic shoulder pain (Fifth Area of Pain) (Bilateral) (R>L) 02/09/2017   Chronic neck pain (Bilateral) (R>L) 02/09/2017   NSAID long-term use 02/09/2017   Osteoarthritis of knee 02/08/2017   Chronic pain syndrome 02/08/2017   Opiate use 02/08/2017    Class: History of   Lumbar paracentral disc bulge at L2-3 (Left) 02/08/2017   Lumbar facet hypertrophy (Bilateral) 02/08/2017   Chronic lumbar radiculitis of L3 (Left) 02/08/2017   Chronic low back pain (Primary Area of Pain) (Bilateral) (L>R) 02/08/2017   Morbid obesity with BMI of 50.0-59.9, adult (HCC) 02/08/2017   Family history of breast cancer 02/01/2017   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) DNA test positive 02/01/2017   Family history of BRCA gene positive 02/01/2017   Dyspepsia 03/26/2014   IBS (irritable bowel syndrome) 03/26/2014   1. OSA (obstructive sleep  apnea) (Primary) The patient does tolerate PAP and reports  benefit from PAP use. The patient was reminded how to clean equipment and advised to replace supplies routinely. The patient was also counselled on weight loss. The compliance is excellent. The AHI is 0.1.   OSA on cpap- controlled. Continue with excellent compliance with pap. CPAP continues to be medically necessary to treat this patient's OSA. F/u one year.    2. CPAP use counseling CPAP Counseling: had a lengthy discussion with the patient regarding the importance of PAP therapy in management of the sleep apnea. Patient appears to understand the risk factor reduction and also understands the risks associated with untreated sleep apnea. Patient will try to make a good faith effort to remain compliant with therapy. Also instructed the patient on proper cleaning of the device including the water must be changed daily if possible and use of distilled water is preferred. Patient understands that the machine should be regularly cleaned with appropriate recommended cleaning solutions that do not damage the PAP machine for example given white vinegar and water rinses. Other methods such as ozone treatment may not be as good as these simple methods to achieve cleaning.   3. Morbid obesity (HCC) Obesity Counseling: Had a lengthy discussion regarding patients BMI and weight issues. Patient was instructed on portion control as well as increased activity. Also discussed caloric restrictions with trying to maintain intake less than 2000 Kcal. Discussions were made in accordance with the 5As  of weight management. Simple actions such as not eating late and if able to, taking a walk is suggested.      General Counseling: I have discussed the findings of the evaluation and examination with [facility].  I have also discussed any further diagnostic evaluation thatmay be needed or ordered today. Tommy verbalizes understanding of the findings of todays visit. We  also reviewed her medications today and discussed drug interactions and side effects including but not limited excessive drowsiness and altered mental states. We also discussed that there is always a risk not just to her but also people around her. she has been encouraged to call the office with any questions or concerns that should arise related to todays visit.  No orders of the defined types were placed in this encounter.       I have personally obtained a history, examined the patient, evaluated laboratory and imaging results, formulated the assessment and plan and placed orders. This patient was seen today by Lauraine Lay, PA-C in collaboration with Dr. Elfreda Bathe.   Elfreda DELENA Bathe, MD [facility] Diplomate ABMS Pulmonary Critical Care Medicine and Sleep Medicine

## 2024-03-12 ENCOUNTER — Ambulatory Visit (INDEPENDENT_AMBULATORY_CARE_PROVIDER_SITE_OTHER): Payer: MEDICAID | Admitting: Internal Medicine

## 2024-03-12 VITALS — BP 145/76 | HR 88 | Resp 16 | Ht 66.0 in | Wt 360.0 lb

## 2024-03-12 DIAGNOSIS — G4733 Obstructive sleep apnea (adult) (pediatric): Secondary | ICD-10-CM | POA: Diagnosis not present

## 2024-03-12 DIAGNOSIS — Z7189 Other specified counseling: Secondary | ICD-10-CM

## 2024-03-12 NOTE — Patient Instructions (Signed)

## 2024-05-13 ENCOUNTER — Emergency Department: Payer: MEDICAID

## 2024-05-13 ENCOUNTER — Emergency Department
Admission: EM | Admit: 2024-05-13 | Discharge: 2024-05-14 | Disposition: A | Discharge status: Home / Self Care | Payer: MEDICAID | Attending: Emergency Medicine | Admitting: Emergency Medicine

## 2024-05-13 ENCOUNTER — Other Ambulatory Visit: Payer: Self-pay

## 2024-05-13 DIAGNOSIS — S39012A Strain of muscle, fascia and tendon of lower back, initial encounter: Secondary | ICD-10-CM | POA: Diagnosis not present

## 2024-05-13 DIAGNOSIS — X58XXXA Exposure to other specified factors, initial encounter: Secondary | ICD-10-CM | POA: Insufficient documentation

## 2024-05-13 DIAGNOSIS — E119 Type 2 diabetes mellitus without complications: Secondary | ICD-10-CM | POA: Insufficient documentation

## 2024-05-13 DIAGNOSIS — J449 Chronic obstructive pulmonary disease, unspecified: Secondary | ICD-10-CM | POA: Diagnosis not present

## 2024-05-13 DIAGNOSIS — M545 Low back pain, unspecified: Secondary | ICD-10-CM | POA: Diagnosis present

## 2024-05-13 LAB — COMPREHENSIVE METABOLIC PANEL WITH GFR
ALT: 20 U/L (ref 0–44)
AST: 21 U/L (ref 15–41)
Albumin: 3.3 g/dL — ABNORMAL LOW (ref 3.5–5.0)
Alkaline Phosphatase: 68 U/L (ref 38–126)
Anion gap: 11 (ref 5–15)
BUN: 16 mg/dL (ref 6–20)
CO2: 24 mmol/L (ref 22–32)
Calcium: 8.9 mg/dL (ref 8.9–10.3)
Chloride: 100 mmol/L (ref 98–111)
Creatinine, Ser: 0.93 mg/dL (ref 0.44–1.00)
GFR, Estimated: >60 mL/min (ref >60)
Glucose, Bld: 154 mg/dL — ABNORMAL HIGH (ref 70–99)
Potassium: 3.9 mmol/L (ref 3.5–5.1)
Sodium: 135 mmol/L (ref 135–145)
Total Bilirubin: 0.5 mg/dL (ref 0.0–1.2)
Total Protein: 8 g/dL (ref 6.5–8.1)

## 2024-05-13 LAB — PREGNANCY, URINE: Preg Test, Ur: NEGATIVE

## 2024-05-13 LAB — CBC
HCT: 39.8 % (ref 36.0–46.0)
Hemoglobin: 12.7 g/dL (ref 12.0–15.0)
MCH: 28.9 pg (ref 26.0–34.0)
MCHC: 31.9 g/dL (ref 30.0–36.0)
MCV: 90.5 fL (ref 80.0–100.0)
Platelets: 385 K/uL (ref 150–400)
RBC: 4.4 MIL/uL (ref 3.87–5.11)
RDW: 13.9 % (ref 11.5–15.5)
WBC: 14.8 K/uL — ABNORMAL HIGH (ref 4.0–10.5)
nRBC: 0 % (ref 0.0–0.2)

## 2024-05-13 LAB — LIPASE, BLOOD: Lipase: 52 U/L — ABNORMAL HIGH (ref 11–51)

## 2024-05-13 LAB — URINALYSIS, ROUTINE W REFLEX MICROSCOPIC
Bilirubin Urine: NEGATIVE
Glucose, UA: NEGATIVE mg/dL
Hgb urine dipstick: NEGATIVE
Ketones, ur: NEGATIVE mg/dL
Leukocytes,Ua: NEGATIVE
Nitrite: NEGATIVE
Protein, ur: NEGATIVE mg/dL
Specific Gravity, Urine: 1.005 (ref 1.005–1.030)
pH: 6 (ref 5.0–8.0)

## 2024-05-13 MED ORDER — SODIUM CHLORIDE 0.9 % IV BOLUS
500.0000 mL | Freq: Once | INTRAVENOUS | Status: AC
  Administered 2024-05-14: 500 mL via INTRAVENOUS

## 2024-05-13 MED ORDER — KETOROLAC TROMETHAMINE 30 MG/ML IJ SOLN
30.0000 mg | Freq: Once | INTRAMUSCULAR | Status: AC
  Administered 2024-05-14: 30 mg via INTRAVENOUS
  Filled 2024-05-13: qty 1

## 2024-05-13 NOTE — ED Triage Notes (Signed)
 Pt reports she has felt dehydrated over past few days due to diarrhea from being on ozempic, pt reports lower back pain

## 2024-05-14 MED ORDER — METHOCARBAMOL 500 MG PO TABS: 500.0000 mg | ORAL_TABLET | Freq: Three times a day (TID) | ORAL | 1 refills | Status: AC | PRN

## 2024-05-14 MED ORDER — NAPROXEN 500 MG PO TABS: 500.0000 mg | ORAL_TABLET | Freq: Two times a day (BID) | ORAL | 2 refills | Status: AC

## 2024-05-14 NOTE — ED Provider Notes (Signed)
 [facility] Provider Note    Event Date/Time   First MD Initiated Contact with Patient 05/13/24 2312     (approximate)   History   Back Pain and Diarrhea (/)   HPI  Amy Cisneros is a 48 y.o. female   with a history of COPD, diabetes who presents with complaints of right lower back pain as well as diarrhea.  Patient attributes the diarrhea to irritable bowel syndrome, she reports this is chronic for her, she is also on Ozempic and thinks this may have worsened it.  Primarily she is concerned of right sided back pain which has been present for about 3 days and seems to be more severe.  No lower extremity weakness or numbness, no loss of bowel or bladder continence.  No fevers.      Physical Exam   Triage Vital Signs: ED Triage Vitals  Encounter Vitals Group     BP 05/13/24 2107 (!) 148/92     Girls Systolic BP Percentile --      Girls Diastolic BP Percentile --      Boys Systolic BP Percentile --      Boys Diastolic BP Percentile --      Pulse Rate 05/13/24 2107 84     Resp 05/13/24 2107 19     Temp 05/13/24 2107 97.6 F (36.4 C)     Temp Source 05/13/24 2315 Oral     SpO2 05/13/24 2107 100 %     Weight 05/13/24 2106 (!) 157.9 kg (348 lb)     Height 05/13/24 2106 1.676 m (5' 6)     Head Circumference --      Peak Flow --      Pain Score 05/13/24 2106 10     Pain Loc --      Pain Education --      Exclude from Growth Chart --     Most recent vital signs: Vitals:   05/13/24 2107 05/13/24 2315  BP: (!) 148/92 (!) 157/74  Pulse: 84 78  Resp: 19 17  Temp: 97.6 F (36.4 C) 98.2 F (36.8 C)  SpO2: 100% 100%     General: Awake, no distress.  CV:  Good peripheral perfusion.  Resp:  Normal effort.  Abd:  No distention.  Other:  Patient has mild right lumbar paraspinal tenderness, suspicious for muscle spasm, no CVA tenderness, normal strength in the lower extremities, normal pulses in both DPs   ED Results / Procedures / Treatments    Labs (all labs ordered are listed, but only abnormal results are displayed) Labs Reviewed  LIPASE, BLOOD - Abnormal; Notable for the following components:      Result Value   Lipase 52 (*)    All other components within normal limits  COMPREHENSIVE METABOLIC PANEL WITH GFR - Abnormal; Notable for the following components:   Glucose, Bld 154 (*)    Albumin 3.3 (*)    All other components within normal limits  CBC - Abnormal; Notable for the following components:   WBC 14.8 (*)    All other components within normal limits  URINALYSIS, ROUTINE W REFLEX MICROSCOPIC - Abnormal; Notable for the following components:   Color, Urine YELLOW (*)    APPearance HAZY (*)    All other components within normal limits  PREGNANCY, URINE     EKG     RADIOLOGY CT renal stone study viewed interpreted by me, no right-sided ureterolithiasis, radiology reports negative for acute abnormality  PROCEDURES:  Critical Care performed:   Procedures   MEDICATIONS ORDERED IN ED: Medications  sodium chloride  0.9 % bolus 500 mL (0 mLs Intravenous Stopped 05/14/24 0032)  ketorolac  (TORADOL ) 30 MG/ML injection 30 mg (30 mg Intravenous Given 05/14/24 0010)     IMPRESSION / MDM / ASSESSMENT AND PLAN / ED COURSE  I reviewed the triage vital signs and the nursing notes. Patient's presentation is most consistent with acute illness / injury with system symptoms.  Patient presents with right-sided back pain as above, differential includes muscle spasm, muscle strain, ureterolithiasis, UTI  Overall well-appearing and in no acute distress, afebrile.  Will obtain labs, urinalysis, CT renal stone send and treat with IV fluids and IV Toradol   Lab work demonstrates mild elevation of white blood cell count which is nonspecific, no evidence of urinary tract infection, CT negative for ureterolithiasis, most consistent with muscle strain, will treat with NSAIDs, outpatient follow-up recommended         FINAL CLINICAL IMPRESSION(S) / ED DIAGNOSES   Final diagnoses:  Strain of lumbar region, initial encounter     Rx / DC Orders   ED Discharge Orders          Ordered    naproxen (NAPROSYN) 500 MG tablet  2 times daily with meals        05/14/24 0018    methocarbamol (ROBAXIN) 500 MG tablet  Every 8 hours PRN        05/14/24 0018             Note:  This document was prepared using Dragon voice recognition software and may include unintentional dictation errors.   Arlander Charleston, MD 05/14/24 [phone]

## 2024-07-22 DIAGNOSIS — E1169 Type 2 diabetes mellitus with other specified complication: Secondary | ICD-10-CM | POA: Insufficient documentation

## 2024-07-22 DIAGNOSIS — G2581 Restless legs syndrome: Secondary | ICD-10-CM | POA: Insufficient documentation

## 2024-07-22 DIAGNOSIS — E559 Vitamin D deficiency, unspecified: Secondary | ICD-10-CM | POA: Insufficient documentation

## 2024-07-22 NOTE — Patient Instructions (Incomplete)

## 2024-07-27 ENCOUNTER — Ambulatory Visit: Payer: MEDICAID | Admitting: Nurse Practitioner

## 2024-07-27 DIAGNOSIS — G4733 Obstructive sleep apnea (adult) (pediatric): Secondary | ICD-10-CM

## 2024-07-27 DIAGNOSIS — G894 Chronic pain syndrome: Secondary | ICD-10-CM

## 2024-07-27 DIAGNOSIS — E1169 Type 2 diabetes mellitus with other specified complication: Secondary | ICD-10-CM

## 2024-07-27 DIAGNOSIS — E11610 Type 2 diabetes mellitus with diabetic neuropathic arthropathy: Secondary | ICD-10-CM

## 2024-07-27 DIAGNOSIS — E1159 Type 2 diabetes mellitus with other circulatory complications: Secondary | ICD-10-CM

## 2024-07-27 DIAGNOSIS — E559 Vitamin D deficiency, unspecified: Secondary | ICD-10-CM

## 2024-07-27 DIAGNOSIS — F3162 Bipolar disorder, current episode mixed, moderate: Secondary | ICD-10-CM

## 2024-07-27 DIAGNOSIS — F411 Generalized anxiety disorder: Secondary | ICD-10-CM

## 2024-07-27 DIAGNOSIS — G2581 Restless legs syndrome: Secondary | ICD-10-CM

## 2024-08-20 ENCOUNTER — Ambulatory Visit: Payer: MEDICAID

## 2024-08-29 ENCOUNTER — Ambulatory Visit: Payer: MEDICAID | Admitting: Family Medicine
